# Patient Record
Sex: Male | Born: 1955 | Race: Black or African American | Hispanic: No | Marital: Single | Smoking: Never smoker
Health system: Southern US, Community
[De-identification: ages and names within clinical notes are randomized; demographics above are authoritative.]

## PROBLEM LIST (undated history)

## (undated) DIAGNOSIS — I10 Essential (primary) hypertension: Secondary | ICD-10-CM

## (undated) DIAGNOSIS — J45909 Unspecified asthma, uncomplicated: Secondary | ICD-10-CM

---

## 2014-11-02 ENCOUNTER — Emergency Department (HOSPITAL_COMMUNITY): Payer: Self-pay

## 2014-11-02 ENCOUNTER — Encounter (HOSPITAL_COMMUNITY): Payer: Self-pay | Admitting: *Deleted

## 2014-11-02 ENCOUNTER — Inpatient Hospital Stay (HOSPITAL_COMMUNITY)
Admission: EM | Admit: 2014-11-02 | Discharge: 2014-11-07 | DRG: 202 | Disposition: A | Discharge status: Home / Self Care | Payer: Self-pay | Attending: Internal Medicine | Admitting: Internal Medicine

## 2014-11-02 DIAGNOSIS — R9431 Abnormal electrocardiogram [ECG] [EKG]: Secondary | ICD-10-CM | POA: Diagnosis present

## 2014-11-02 DIAGNOSIS — J45901 Unspecified asthma with (acute) exacerbation: Principal | ICD-10-CM | POA: Diagnosis present

## 2014-11-02 DIAGNOSIS — E119 Type 2 diabetes mellitus without complications: Secondary | ICD-10-CM | POA: Diagnosis present

## 2014-11-02 DIAGNOSIS — E876 Hypokalemia: Secondary | ICD-10-CM | POA: Diagnosis present

## 2014-11-02 DIAGNOSIS — J9601 Acute respiratory failure with hypoxia: Secondary | ICD-10-CM | POA: Diagnosis present

## 2014-11-02 DIAGNOSIS — J4541 Moderate persistent asthma with (acute) exacerbation: Secondary | ICD-10-CM

## 2014-11-02 DIAGNOSIS — T380X5A Adverse effect of glucocorticoids and synthetic analogues, initial encounter: Secondary | ICD-10-CM | POA: Diagnosis not present

## 2014-11-02 DIAGNOSIS — N289 Disorder of kidney and ureter, unspecified: Secondary | ICD-10-CM | POA: Diagnosis not present

## 2014-11-02 DIAGNOSIS — J45909 Unspecified asthma, uncomplicated: Secondary | ICD-10-CM | POA: Insufficient documentation

## 2014-11-02 DIAGNOSIS — Z6841 Body Mass Index (BMI) 40.0 and over, adult: Secondary | ICD-10-CM

## 2014-11-02 DIAGNOSIS — R945 Abnormal results of liver function studies: Secondary | ICD-10-CM

## 2014-11-02 DIAGNOSIS — J9600 Acute respiratory failure, unspecified whether with hypoxia or hypercapnia: Secondary | ICD-10-CM | POA: Diagnosis present

## 2014-11-02 DIAGNOSIS — R7989 Other specified abnormal findings of blood chemistry: Secondary | ICD-10-CM | POA: Diagnosis present

## 2014-11-02 DIAGNOSIS — J1100 Influenza due to unidentified influenza virus with unspecified type of pneumonia: Secondary | ICD-10-CM | POA: Diagnosis present

## 2014-11-02 DIAGNOSIS — D72829 Elevated white blood cell count, unspecified: Secondary | ICD-10-CM

## 2014-11-02 DIAGNOSIS — R0600 Dyspnea, unspecified: Secondary | ICD-10-CM

## 2014-11-02 DIAGNOSIS — E669 Obesity, unspecified: Secondary | ICD-10-CM | POA: Diagnosis present

## 2014-11-02 DIAGNOSIS — T501X5A Adverse effect of loop [high-ceiling] diuretics, initial encounter: Secondary | ICD-10-CM | POA: Diagnosis not present

## 2014-11-02 DIAGNOSIS — I10 Essential (primary) hypertension: Secondary | ICD-10-CM | POA: Diagnosis present

## 2014-11-02 DIAGNOSIS — R197 Diarrhea, unspecified: Secondary | ICD-10-CM | POA: Diagnosis present

## 2014-11-02 HISTORY — DX: Unspecified asthma, uncomplicated: J45.909

## 2014-11-02 HISTORY — DX: Essential (primary) hypertension: I10

## 2014-11-02 LAB — CBC
HEMATOCRIT: 43.6 % (ref 39.0–52.0)
Hemoglobin: 14.2 g/dL (ref 13.0–17.0)
MCH: 25.9 pg — ABNORMAL LOW (ref 26.0–34.0)
MCHC: 32.6 g/dL (ref 30.0–36.0)
MCV: 79.4 fL (ref 78.0–100.0)
Platelets: 321 10*3/uL (ref 150–400)
RBC: 5.49 MIL/uL (ref 4.22–5.81)
RDW: 14.5 % (ref 11.5–15.5)
WBC: 7.2 10*3/uL (ref 4.0–10.5)

## 2014-11-02 LAB — I-STAT TROPONIN, ED: Troponin i, poc: 0.04 ng/mL (ref 0.00–0.08)

## 2014-11-02 LAB — BASIC METABOLIC PANEL
Anion gap: 8 (ref 5–15)
BUN: 9 mg/dL (ref 6–23)
CO2: 28 mmol/L (ref 19–32)
Calcium: 8 mg/dL — ABNORMAL LOW (ref 8.4–10.5)
Chloride: 102 mmol/L (ref 96–112)
Creatinine, Ser: 1.06 mg/dL (ref 0.50–1.35)
GFR, EST AFRICAN AMERICAN: 89 mL/min — AB (ref >90)
GFR, EST NON AFRICAN AMERICAN: 77 mL/min — AB (ref >90)
Glucose, Bld: 118 mg/dL — ABNORMAL HIGH (ref 70–99)
POTASSIUM: 3.4 mmol/L — AB (ref 3.5–5.1)
Sodium: 138 mmol/L (ref 135–145)

## 2014-11-02 LAB — LIPID PANEL
CHOLESTEROL: 120 mg/dL (ref 0–200)
HDL: 29 mg/dL — ABNORMAL LOW (ref >39)
LDL Cholesterol: 72 mg/dL (ref 0–99)
Total CHOL/HDL Ratio: 4.1 RATIO
Triglycerides: 94 mg/dL (ref <150)
VLDL: 19 mg/dL (ref 0–40)

## 2014-11-02 LAB — TROPONIN I: Troponin I: 0.05 ng/mL — ABNORMAL HIGH (ref <0.031)

## 2014-11-02 LAB — BRAIN NATRIURETIC PEPTIDE: B Natriuretic Peptide: 66 pg/mL (ref 0.0–100.0)

## 2014-11-02 MED ORDER — SODIUM CHLORIDE 0.9 % IJ SOLN
3.0000 mL | Freq: Two times a day (BID) | INTRAMUSCULAR | Status: DC
Start: 2014-11-02 — End: 2014-11-07
  Administered 2014-11-02 – 2014-11-07 (×9): 3 mL via INTRAVENOUS

## 2014-11-02 MED ORDER — DEXTROSE 5 % IV SOLN
1.0000 g | INTRAVENOUS | Status: DC
  Administered 2014-11-03 – 2014-11-04 (×2): 1 g via INTRAVENOUS
  Filled 2014-11-02 (×3): qty 10

## 2014-11-02 MED ORDER — ACETAMINOPHEN 650 MG RE SUPP
650.0000 mg | Freq: Four times a day (QID) | RECTAL | Status: DC | PRN
Start: 2014-11-02 — End: 2014-11-07

## 2014-11-02 MED ORDER — IPRATROPIUM-ALBUTEROL 0.5-2.5 (3) MG/3ML IN SOLN
3.0000 mL | RESPIRATORY_TRACT | Status: DC
  Administered 2014-11-02 – 2014-11-03 (×4): 3 mL via RESPIRATORY_TRACT
  Filled 2014-11-02 (×4): qty 3

## 2014-11-02 MED ORDER — GUAIFENESIN-DM 100-10 MG/5ML PO SYRP
5.0000 mL | ORAL_SOLUTION | ORAL | Status: DC | PRN
  Administered 2014-11-02 – 2014-11-03 (×2): 5 mL via ORAL
  Filled 2014-11-02 (×3): qty 5

## 2014-11-02 MED ORDER — DEXTROSE 5 % IV SOLN
500.0000 mg | INTRAVENOUS | Status: DC
  Administered 2014-11-03 – 2014-11-04 (×2): 500 mg via INTRAVENOUS
  Filled 2014-11-02 (×3): qty 500

## 2014-11-02 MED ORDER — FUROSEMIDE 10 MG/ML IJ SOLN
40.0000 mg | Freq: Once | INTRAMUSCULAR | Status: AC
  Administered 2014-11-02: 40 mg via INTRAVENOUS
  Filled 2014-11-02: qty 4

## 2014-11-02 MED ORDER — ALBUTEROL (5 MG/ML) CONTINUOUS INHALATION SOLN
10.0000 mg/h | INHALATION_SOLUTION | Freq: Once | RESPIRATORY_TRACT | Status: AC
  Administered 2014-11-02: 10 mg/h via RESPIRATORY_TRACT
  Filled 2014-11-02: qty 20

## 2014-11-02 MED ORDER — ALUM & MAG HYDROXIDE-SIMETH 200-200-20 MG/5ML PO SUSP: 30.0000 mL | Freq: Four times a day (QID) | ORAL | Status: DC | PRN

## 2014-11-02 MED ORDER — POTASSIUM CHLORIDE CRYS ER 20 MEQ PO TBCR: 40.0000 meq | EXTENDED_RELEASE_TABLET | Freq: Once | ORAL | Status: DC

## 2014-11-02 MED ORDER — BENZONATATE 100 MG PO CAPS
100.0000 mg | ORAL_CAPSULE | Freq: Three times a day (TID) | ORAL | Status: DC
  Administered 2014-11-02 – 2014-11-07 (×15): 100 mg via ORAL
  Filled 2014-11-02 (×16): qty 1

## 2014-11-02 MED ORDER — IPRATROPIUM BROMIDE 0.02 % IN SOLN
0.5000 mg | Freq: Once | RESPIRATORY_TRACT | Status: AC
  Administered 2014-11-02: 0.5 mg via RESPIRATORY_TRACT
  Filled 2014-11-02: qty 2.5

## 2014-11-02 MED ORDER — ONDANSETRON HCL 4 MG PO TABS: 4.0000 mg | ORAL_TABLET | Freq: Four times a day (QID) | ORAL | Status: DC | PRN

## 2014-11-02 MED ORDER — ACETAMINOPHEN 325 MG PO TABS: 650.0000 mg | ORAL_TABLET | Freq: Four times a day (QID) | ORAL | Status: DC | PRN

## 2014-11-02 MED ORDER — METHYLPREDNISOLONE SODIUM SUCC 125 MG IJ SOLR
60.0000 mg | Freq: Four times a day (QID) | INTRAMUSCULAR | Status: DC
  Administered 2014-11-02 – 2014-11-03 (×3): 60 mg via INTRAVENOUS
  Filled 2014-11-02 (×2): qty 0.96
  Filled 2014-11-02: qty 2
  Filled 2014-11-02 (×3): qty 0.96
  Filled 2014-11-02: qty 2

## 2014-11-02 MED ORDER — METHYLPREDNISOLONE SODIUM SUCC 125 MG IJ SOLR
125.0000 mg | Freq: Once | INTRAMUSCULAR | Status: AC
  Administered 2014-11-02: 125 mg via INTRAVENOUS
  Filled 2014-11-02: qty 2

## 2014-11-02 MED ORDER — ONDANSETRON HCL 4 MG/2ML IJ SOLN: 4.0000 mg | Freq: Four times a day (QID) | INTRAMUSCULAR | Status: DC | PRN

## 2014-11-02 MED ORDER — ENOXAPARIN SODIUM 40 MG/0.4ML ~~LOC~~ SOLN
40.0000 mg | SUBCUTANEOUS | Status: DC
  Administered 2014-11-02: 40 mg via SUBCUTANEOUS
  Filled 2014-11-02 (×2): qty 0.4

## 2014-11-02 MED ORDER — BUDESONIDE-FORMOTEROL FUMARATE 80-4.5 MCG/ACT IN AERO
2.0000 | INHALATION_SPRAY | Freq: Two times a day (BID) | RESPIRATORY_TRACT | Status: DC
  Administered 2014-11-03 – 2014-11-07 (×9): 2 via RESPIRATORY_TRACT
  Filled 2014-11-02: qty 6.9

## 2014-11-02 NOTE — ED Notes (Signed)
Patient with hx of sob for 1 week, worse for 2 days.  He reports cough with yellow and red sputum.  Patient also reports fevers.  He is weak and color is gray in color.  Patient has no known hx of heart failure.  He has tried tessalon pearls.  Patient has audible congestion   He took tylenol at 0800.  Patient is able to talk but in short sentences

## 2014-11-02 NOTE — ED Notes (Signed)
Respiratory notified pt in need of hour long continuous neb.

## 2014-11-02 NOTE — H&P (Addendum)
History and Physical       Hospital Admission Note Date: 11/02/2014  Patient name: Calvin Ramos Medical record number: [id number] Date of birth: 01/22/1956 Age: 59 y.o. Gender: male  PCP: No PCP Per Patient    Chief Complaint:  Fevers with Shortness of breath and wheezing for last 1 week  HPI: Patient is a 59 year old male with history of asthma, hypertension who presented with shortness of breath for 1 week worsening in the last 2 days. He also reported fevers and chills with temp of 102 F at home with productive cough, yellowish and red streaky sputum. Patient port see has no inhalers or nebulizers at home, was having audible wheezing. In the last 2 days, he also noticed loss of appetite, myalgias and shortness of breath worsening. Patient reports that in the last 1 year he has noticed dyspnea on exertion, he feels short of breath on walking one room to another. He denies any significant orthopnea or PND although sleeps on "big pillow" which makes him breathe better.  He has not seen any physician in the last several years. Patient was taking Tessalon Perles from his relative which did not help. He also admits to having diarrhea in last 1 week intermittently. Patient denies any chest pain, dizziness or lightheadedness.  In ED, patient received 1 dose of IV Lasix and prolonged albuterol nebulizer treatment with some improvement. BNP 66.0 troponin negative EKG showed rate 79, prolonged QTC 559, T-wave inversions V3 to V6, 2, 3 aVF.   Review of Systems:  Constitutional: +fever, chills, diaphoresis, poor appetite and fatigue.  HEENT: Denies photophobia, eye pain, redness, hearing loss, ear pain,+  congestion, sore throat, rhinorrhea, sneezing, mouth sores, trouble swallowing, neck pain, neck stiffness and tinnitus.   Respiratory: Please see history of present illness Cardiovascular: Denies chest pain, palpitations and leg swelling.   Gastrointestinal: Denies vomiting, abdominal pain, constipation, blood in stool and abdominal distention. + nausea, diarrhea  Genitourinary: Denies dysuria, urgency, frequency, hematuria, flank pain and difficulty urinating.  Musculoskeletal: Denies back pain, joint swelling, arthralgias and gait problem. + myalgias  Skin: Denies pallor, rash and wound.  Neurological: Denies dizziness, seizures, syncope, weakness, light-headedness, numbness and headaches.  Hematological: Denies adenopathy. Easy bruising, personal or family bleeding history  Psychiatric/Behavioral: Denies suicidal ideation, mood changes, confusion, nervousness, sleep disturbance and agitation  Past Medical History: Past Medical History  Diagnosis Date  . Hypertension   . Asthma    History reviewed. No pertinent past surgical history.  Medications: Prior to Admission medications    Patient is not taking any medications prior to admission, was taking Tessalon Perles from his relative in the last 2 days.  Allergies:  No Known Allergies  Social History:  reports that he has never smoked. He does not have any smokeless tobacco history on file. He reports that he does not drink alcohol or use illicit drugs.  Family History: Patient reports that his mother had heart attack in her 2440s otherwise no history of lung cancer, COPD, CHF in the family   Physical Exam: Blood pressure 149/83, pulse 85, temperature 98.2 F (36.8 C), temperature source Oral, resp. rate 26, height 5\' 7"  (1.702 m), weight 166.527 kg (367 lb 2 oz), SpO2 100 %. General: Alert, awake, oriented x3, in no acute distress, able to speak in full sentences . HEENT: normocephalic, atraumatic, anicteric sclera, pink conjunctiva, pupils equal and reactive to light and accomodation, oropharynx clear Neck: supple, no masses or lymphadenopathy, no goiter, no bruits  Heart: Regular rate  and rhythm, without murmurs, rubs or gallops. Lungs: coarse breath sounds  throughout with wheezing   Abdomen:  obese,Soft, nontender, nondistended, positive bowel sounds, no masses. Extremities: No clubbing, cyanosis or edema with positive pedal pulses. Neuro: Grossly intact, no focal neurological deficits, strength 5/5 upper and lower extremities bilaterally Psych: alert and oriented x 3, normal mood and affect Skin: no rashes or lesions, warm and dry   LABS on Admission:  Basic Metabolic Panel:  Recent Labs Lab 11/02/14 1327  NA 138  K 3.4*  CL 102  CO2 28  GLUCOSE 118*  BUN 9  CREATININE 1.06  CALCIUM 8.0*   Liver Function Tests: No results for input(s): AST, ALT, ALKPHOS, BILITOT, PROT, ALBUMIN in the last 168 hours. No results for input(s): LIPASE, AMYLASE in the last 168 hours. No results for input(s): AMMONIA in the last 168 hours. CBC:  Recent Labs Lab 11/02/14 1327  WBC 7.2  HGB 14.2  HCT 43.6  MCV 79.4  PLT 321   Cardiac Enzymes: No results for input(s): CKTOTAL, CKMB, CKMBINDEX, TROPONINI in the last 168 hours. BNP: Invalid input(s): POCBNP CBG: No results for input(s): GLUCAP in the last 168 hours.   Radiological Exams on Admission: Dg Chest Port 1 View  11/02/2014   CLINICAL DATA:  Increasing shortness of breath with cough and congestion.  EXAM: PORTABLE CHEST - 1 VIEW  COMPARISON:  None.  FINDINGS: Heart size is upper limits of normal. Central vascular structures are mildly prominent. No focal airspace disease. Patient is mildly rotated towards the right.  IMPRESSION: Slightly prominent lung markings could represent vascular congestion. No focal airspace disease.   Electronically Signed   By: Richarda Overlie M.D.   On: 11/02/2014 15:58    EKG showed rate 79, prolonged QTC 559, T-wave inversions in V3 to V6, 2, 3, aVF  Assessment/Plan Principal Problem:   Acute respiratory failure with hypoxia, dyspnea: Likely due to acute bronchitis, asthma exacerbation, also rule out any underlying heart failure, given his dyspnea on exertion  for a year, EKG changes. O2 sats 88% on room air at the time of admission -  obtain serial cardiac enzymes, influenza panel, sputum culture and sensitivities  - Placed on scheduled DuoNeb, IV Solu-Medrol, IV Zithromax, Rocephin, O2, Tessalon Perles, Robitussin - Obtain 2-D echocardiogram for further workup, may need cardiology consult   Active Problems:    Obesity - Patient counseled on diet and weight control    Essential hypertension - Currently not on any antihypertensives - BP elevated at the time of admission however patient was wheezing and in acute respiratory failure. May need to be on antihypertensives if  BP readings consistently elevated. - For now place on IV hydralazine as needed    Hypokalemia - Replaced    Abnormal EKG - Obtain serial cardiac enzymes, obtain lipid panel, no chest pain or active cardiac symptoms - obtain 2-D echocardiogram   Diarrhea - Obtain stool stool studies and C. difficile   DVT prophylaxis: LOVENOX   CODE STATUS:  full code   Family Communication: Admission, patients condition and plan of care including tests being ordered have been discussed with the patient and daughter who indicates understanding and agree with the plan and Code Status   Further plan will depend as patient's clinical course evolves and further radiologic and laboratory data become available.   Time Spent on Admission: 1 hour  RAI,RIPUDEEP M.D. [facility] 11/02/2014, 6:07 PM Pager: [phone]  If 7PM-7AM, please contact night-coverage [email or web address] Password [email or web address]

## 2014-11-02 NOTE — Progress Notes (Signed)
NURSING PROGRESS NOTE  Calvin DubinLuster Dicola [id number] Admission Data: 11/02/2014 7:40 PM Attending Provider: Cathren Harshipudeep K Rai, MD PCP:No PCP Per Patient Code Status: Full  Allergies:  Review of patient's allergies indicates no known allergies. Past Medical History:   has a past medical history of Hypertension and Asthma. Past Surgical History:   has no past surgical history on file. Social History:   reports that he has never smoked. He does not have any smokeless tobacco history on file. He reports that he does not drink alcohol or use illicit drugs.  Calvin Ramos is a 59 y.o. male patient admitted from ED:   Last Documented Vital Signs: Blood pressure 180/99, pulse 95, temperature 99 F (37.2 C), temperature source Oral, resp. rate 20, height 5\' 7"  (1.702 m), weight 164.837 kg (363 lb 6.4 oz), SpO2 95 %.  Cardiac Monitoring: Box # 5 in place. Cardiac monitor yields: No Data Yet  IV Fluids:  IV in place, occlusive dsg intact without redness, IV cath hand right, condition patent and no redness none.   Skin: WDL  Patient/Family orientated to room. Information packet given to patient/family. Admission inpatient armband information verified with patient/family to include name and date of birth and placed on patient arm. Side rails up x 2, fall assessment and education completed with patient/family. Patient/family able to verbalize understanding of risk associated with falls and verbalized understanding to call for assistance before getting out of bed. Call light within reach. Patient/family able to voice and demonstrate understanding of unit orientation instructions.    Will continue to evaluate and treat per MD orders.   Leane PlattSpencer Laquiesha Piacente RN, BS, BSN

## 2014-11-02 NOTE — ED Provider Notes (Signed)
CSN: [id number]     Arrival date & time 11/02/14  1315 History   First MD Initiated Contact with Patient 11/02/14 1502     Chief Complaint  Patient presents with  . Shortness of Breath  . Cough     (Consider location/radiation/quality/duration/timing/severity/associated sxs/prior Treatment) Patient is a 59 y.o. male presenting with shortness of breath and cough. The history is provided by the patient.  Shortness of Breath Severity:  Moderate Onset quality:  Gradual Duration:  1 week Timing:  Constant Progression:  Worsening Chronicity:  New Context: URI (had influenza like syndrome for past few week)   Relieved by:  Nothing Worsened by:  Nothing tried Associated symptoms: cough   Associated symptoms: no abdominal pain, no chest pain, no fever and no vomiting   Cough Associated symptoms: shortness of breath   Associated symptoms: no chest pain and no fever     Past Medical History  Diagnosis Date  . Hypertension   . Asthma    History reviewed. No pertinent past surgical history. No family history on file. History  Substance Use Topics  . Smoking status: Never Smoker   . Smokeless tobacco: Not on file  . Alcohol Use: No    Review of Systems  Constitutional: Negative for fever.  Respiratory: Positive for cough and shortness of breath.   Cardiovascular: Negative for chest pain and leg swelling.  Gastrointestinal: Negative for vomiting and abdominal pain.  All other systems reviewed and are negative.     Allergies  Review of patient's allergies indicates no known allergies.  Home Medications   Prior to Admission medications   Not on File   BP 162/102 mmHg  Pulse 88  Temp(Src) 98.2 F (36.8 C) (Oral)  Resp 90  Ht 5\' 7"  (1.702 m)  Wt 367 lb 2 oz (166.527 kg)  BMI 57.49 kg/m2  SpO2 88% Physical Exam  Constitutional: He is oriented to person, place, and time. He appears well-developed and well-nourished. No distress.  HENT:  Head: Normocephalic and  atraumatic.  Mouth/Throat: No oropharyngeal exudate.  Eyes: EOM are normal. Pupils are equal, round, and reactive to light.  Neck: Normal range of motion. Neck supple.  Cardiovascular: Normal rate and regular rhythm.  Exam reveals no friction rub.   No murmur heard. Pulmonary/Chest: He is in respiratory distress (moderate). He has wheezes (diffuse, moderate). He has no rales.  Abdominal: He exhibits no distension. There is no tenderness. There is no rebound.  Musculoskeletal: Normal range of motion. He exhibits no edema.  Neurological: He is alert and oriented to person, place, and time.  Skin: He is not diaphoretic.  Nursing note and vitals reviewed.   ED Course  Procedures (including critical care time) Labs Review Labs Reviewed  CBC - Abnormal; Notable for the following:    MCH 25.9 (*)    All other components within normal limits  BASIC METABOLIC PANEL - Abnormal; Notable for the following:    Potassium 3.4 (*)    Glucose, Bld 118 (*)    Calcium 8.0 (*)    GFR calc non Af Amer 77 (*)    GFR calc Af Amer 89 (*)    All other components within normal limits  BRAIN NATRIURETIC PEPTIDE  I-STAT TROPOININ, ED    Imaging Review Dg Chest Port 1 View  11/02/2014   CLINICAL DATA:  Increasing shortness of breath with cough and congestion.  EXAM: PORTABLE CHEST - 1 VIEW  COMPARISON:  None.  FINDINGS: Heart size is upper limits  of normal. Central vascular structures are mildly prominent. No focal airspace disease. Patient is mildly rotated towards the right.  IMPRESSION: Slightly prominent lung markings could represent vascular congestion. No focal airspace disease.   Electronically Signed   By: Richarda Overlie M.D.   On: 11/02/2014 15:58     EKG Interpretation   Date/Time:  Tuesday November 02 2014 13:25:57 EST Ventricular Rate:  79 PR Interval:  134 QRS Duration: 102 QT Interval:  488 QTC Calculation: 559 R Axis:   -41 Text Interpretation:  Long QTc Normal sinus rhythm Left axis  deviation ST  \T\ T wave abnormality, consider inferior ischemia ST \\T \ T wave  abnormality, consider anterolateral ischemia Prolonged QT Abnormal ECG No  prior for comparison Confirmed by Gwendolyn Grant  MD, Rolene Andrades ([id number]) on 11/02/2014  3:04:06 PM      MDM   Final diagnoses:  Asthma exacerbation    52M with hx of asthma presents with SOB. Present for past week, felt like he had the flu leading up to this. Ambulatory, but hypoxic on room air. On exam, labored breathing, short, choppy sentences. Patient has diffuse wheezes in all lung fields. Mild peripheral edema. No hx of heart failure. CXr shows vascular congestion, given lasix in case this is CHF. Improving, but still having trouble breathing. Admitted.   Elwin Mocha, MD 11/02/14 [phone]

## 2014-11-03 DIAGNOSIS — J9600 Acute respiratory failure, unspecified whether with hypoxia or hypercapnia: Secondary | ICD-10-CM

## 2014-11-03 LAB — BASIC METABOLIC PANEL
Anion gap: 11 (ref 5–15)
BUN: 13 mg/dL (ref 6–23)
CO2: 28 mmol/L (ref 19–32)
CREATININE: 1.28 mg/dL (ref 0.50–1.35)
Calcium: 8.1 mg/dL — ABNORMAL LOW (ref 8.4–10.5)
Chloride: 99 mmol/L (ref 96–112)
GFR calc Af Amer: 71 mL/min — ABNORMAL LOW (ref >90)
GFR, EST NON AFRICAN AMERICAN: 61 mL/min — AB (ref >90)
Glucose, Bld: 187 mg/dL — ABNORMAL HIGH (ref 70–99)
Potassium: 3.8 mmol/L (ref 3.5–5.1)
SODIUM: 138 mmol/L (ref 135–145)

## 2014-11-03 LAB — CBC
HEMATOCRIT: 42.8 % (ref 39.0–52.0)
Hemoglobin: 13.9 g/dL (ref 13.0–17.0)
MCH: 25.7 pg — ABNORMAL LOW (ref 26.0–34.0)
MCHC: 32.5 g/dL (ref 30.0–36.0)
MCV: 79.3 fL (ref 78.0–100.0)
Platelets: 330 10*3/uL (ref 150–400)
RBC: 5.4 MIL/uL (ref 4.22–5.81)
RDW: 14.5 % (ref 11.5–15.5)
WBC: 6.5 10*3/uL (ref 4.0–10.5)

## 2014-11-03 LAB — INFLUENZA PANEL BY PCR (TYPE A & B)
H1N1FLUPCR: NOT DETECTED
Influenza A By PCR: POSITIVE — AB
Influenza B By PCR: NEGATIVE

## 2014-11-03 LAB — TROPONIN I
Troponin I: 0.04 ng/mL — ABNORMAL HIGH (ref <0.031)
Troponin I: 0.03 ng/mL (ref <0.031)

## 2014-11-03 LAB — CLOSTRIDIUM DIFFICILE BY PCR: CDIFFPCR: NEGATIVE

## 2014-11-03 LAB — EXPECTORATED SPUTUM ASSESSMENT W REFEX TO RESP CULTURE

## 2014-11-03 LAB — EXPECTORATED SPUTUM ASSESSMENT W GRAM STAIN, RFLX TO RESP C

## 2014-11-03 MED ORDER — IPRATROPIUM-ALBUTEROL 0.5-2.5 (3) MG/3ML IN SOLN
3.0000 mL | Freq: Four times a day (QID) | RESPIRATORY_TRACT | Status: DC
  Administered 2014-11-03 – 2014-11-06 (×13): 3 mL via RESPIRATORY_TRACT
  Filled 2014-11-03 (×13): qty 3

## 2014-11-03 MED ORDER — METHYLPREDNISOLONE SODIUM SUCC 125 MG IJ SOLR
60.0000 mg | Freq: Two times a day (BID) | INTRAMUSCULAR | Status: DC
  Administered 2014-11-03 – 2014-11-04 (×2): 60 mg via INTRAVENOUS
  Filled 2014-11-03 (×2): qty 0.96

## 2014-11-03 MED ORDER — ALBUTEROL SULFATE (2.5 MG/3ML) 0.083% IN NEBU: 2.5000 mg | INHALATION_SOLUTION | RESPIRATORY_TRACT | Status: DC | PRN

## 2014-11-03 MED ORDER — ENOXAPARIN SODIUM 80 MG/0.8ML ~~LOC~~ SOLN
80.0000 mg | SUBCUTANEOUS | Status: DC
  Administered 2014-11-03 – 2014-11-06 (×4): 80 mg via SUBCUTANEOUS
  Filled 2014-11-03 (×5): qty 0.8

## 2014-11-03 NOTE — Progress Notes (Signed)
[facility] PROGRESS NOTE  Calvin Ramos ZOX:[id number] DOB: [birth year] DOA: 11/02/2014 PCP: No PCP Per Patient  Assessment/Plan: Acute respiratory failure secondary to asthma exacerbation and possibly early pneumonia. His influenza PCR is positive, but his symptoms started one and half week ago, outside the window of time frame,  he is afebrile and clinically he looks better. Taper steroids, and resume antibiotics. Resume symbicort.   Mildly elevated troponin's: He denies any chest pain.  2 D Echocardiogram ordered.  Repeat EKG pending. LE edema present.    Hypertension: better controlled.    Hypokalemia : replace as needed.   Diarrhea: resolved.     -   Code Status: full code.  Family Communication: none at bedside Disposition Plan: pending PT eval.    Consultants:  none  Procedures:  none  Antibiotics:  Rocephin and zithromax  HPI/Subjective: Sob improved, no chest pain.   Objective: Filed Vitals:   11/03/14 1509  BP: 149/81  Pulse:   Temp: 98.3 F (36.8 C)  Resp: 18    Intake/Output Summary (Last 24 hours) at 11/03/14 1953 Last data filed at 11/03/14 1524  Gross per 24 hour  Intake    960 ml  Output    800 ml  Net    160 ml   Filed Weights   11/02/14 1321 11/02/14 1906  Weight: 166.527 kg (367 lb 2 oz) 164.837 kg (363 lb 6.4 oz)    Exam:   General:  ALERT afebrile comfortable  Cardiovascular: s1s2  Respiratory: no wheezing heard, clear  Abdomen: soft nn tender non distended bowel sounds heard  Musculoskeletal: no pedal edema.   Data Reviewed: Basic Metabolic Panel:  Recent Labs Lab 11/02/14 1327 11/03/14 0225  NA 138 138  K 3.4* 3.8  CL 102 99  CO2 28 28  GLUCOSE 118* 187*  BUN 9 13  CREATININE 1.06 1.28  CALCIUM 8.0* 8.1*   Liver Function Tests: No results for input(s): AST, ALT, ALKPHOS, BILITOT, PROT, ALBUMIN in the last 168 hours. No results for input(s): LIPASE, AMYLASE in the last 168 hours. No results for  input(s): AMMONIA in the last 168 hours. CBC:  Recent Labs Lab 11/02/14 1327 11/03/14 0225  WBC 7.2 6.5  HGB 14.2 13.9  HCT 43.6 42.8  MCV 79.4 79.3  PLT 321 330   Cardiac Enzymes:  Recent Labs Lab 11/02/14 1901 11/03/14 0225 11/03/14 0635  TROPONINI 0.05* 0.04* 0.03   BNP (last 3 results)  Recent Labs  11/02/14 1327  BNP 66.0    ProBNP (last 3 results) No results for input(s): PROBNP in the last 8760 hours.  CBG: No results for input(s): GLUCAP in the last 168 hours.  Recent Results (from the past 240 hour(s))  Clostridium Difficile by PCR     Status: None   Collection Time: 11/03/14  2:04 AM  Result Value Ref Range Status   C difficile by pcr NEGATIVE NEGATIVE Final  Culture, expectorated sputum-assessment     Status: None   Collection Time: 11/03/14  2:04 AM  Result Value Ref Range Status   Specimen Description SPUTUM  Final   Special Requests NONE  Final   Sputum evaluation   Final    THIS SPECIMEN IS ACCEPTABLE. RESPIRATORY CULTURE REPORT TO FOLLOW.   Report Status 11/03/2014 FINAL  Final  Culture, respiratory (NON-Expectorated)     Status: None (Preliminary result)   Collection Time: 11/03/14  2:04 AM  Result Value Ref Range Status   Specimen Description SPUTUM  Final  Special Requests NONE  Final   Gram Stain   Final    ABUNDANT WBC PRESENT,BOTH PMN AND MONONUCLEAR RARE SQUAMOUS EPITHELIAL CELLS PRESENT FEW GRAM NEGATIVE RODS FEW GRAM POSITIVE COCCI IN PAIRS IN CHAINS RARE GRAM POSITIVE RODS    Culture PENDING  Incomplete   Report Status PENDING  Incomplete     Studies: Dg Chest Port 1 View  11/02/2014   CLINICAL DATA:  Increasing shortness of breath with cough and congestion.  EXAM: PORTABLE CHEST - 1 VIEW  COMPARISON:  None.  FINDINGS: Heart size is upper limits of normal. Central vascular structures are mildly prominent. No focal airspace disease. Patient is mildly rotated towards the right.  IMPRESSION: Slightly prominent lung markings  could represent vascular congestion. No focal airspace disease.   Electronically Signed   By: Richarda OverlieAdam  Henn M.D.   On: 11/02/2014 15:58    Scheduled Meds: . azithromycin  500 mg Intravenous Q24H  . benzonatate  100 mg Oral TID  . budesonide-formoterol  2 puff Inhalation BID  . cefTRIAXone (ROCEPHIN)  IV  1 g Intravenous Q24H  . enoxaparin (LOVENOX) injection  80 mg Subcutaneous Q24H  . ipratropium-albuterol  3 mL Nebulization Q6H  . methylPREDNISolone (SOLU-MEDROL) injection  60 mg Intravenous Q12H  . potassium chloride  40 mEq Oral Once  . sodium chloride  3 mL Intravenous Q12H   Continuous Infusions:   Principal Problem:   Acute respiratory failure Active Problems:   Acute asthma exacerbation   Dyspnea   Obesity   Essential hypertension   Hypokalemia   Abnormal EKG    Time spent: 25 MINUTES    Charl[facility] Va Medical CenterKULA,Reinette Cuneo  [facility] Pager [phone]. If 7PM-7AM, please contact night-coverage at [email or web address], password [email or web address] 11/03/2014, 7:53 PM  LOS: 0 days

## 2014-11-03 NOTE — Care Management Note (Unsigned)
    Page 1 of 1   11/03/2014     12:28:30 PM CARE MANAGEMENT NOTE 11/03/2014  Patient:  Calvin Ramos,Calvin Ramos   Account Number:  [id number]  Date Initiated:  11/03/2014  Documentation initiated by:  Letha CapeAYLOR,Maleah Rabago  Subjective/Objective Assessment:   dx flu, asthma ex  admit- from home     Action/Plan:   Anticipated DC Date:  11/03/2014   Anticipated DC Plan:  HOME/SELF CARE      DC Planning Services  CM consult  [facility]      Choice offered to / List presented to:             Status of service:  In process, will continue to follow Medicare Important Message given?  NO (If response is "NO", the following Medicare IM given date fields will be blank) Date Medicare IM given:   Medicare IM given by:   Date Additional Medicare IM given:   Additional Medicare IM given by:    Discharge Disposition:    Per UR Regulation:  Reviewed for med. necessity/level of care/duration of stay  If discussed at Long Length of Stay Meetings, dates discussed:    Comments:  11/03/14 1226 Letha Capeeborah Teressa Mcglocklin RN BSN [phone] NCM gave patient information on [facility] for a walk in apt after discharge.  He will  need to go there to get his medications as well for $4 or $10 at discharge, they close at 6 pm.

## 2014-11-03 NOTE — Progress Notes (Signed)
HO Akula paged, EKG complete and in patient chart.

## 2014-11-03 NOTE — Progress Notes (Signed)
HO Akula paged to let her know our unit does not have EKG machine, and we have been trying to borrow one from another without success since the 1st order was placed, we are continually looking and will notify the MD as soon as we get the EKG.

## 2014-11-03 NOTE — Progress Notes (Signed)
UR completed 

## 2014-11-03 NOTE — Progress Notes (Signed)
HO Akula made aware face to face of patient decreasing GFR.

## 2014-11-03 NOTE — Progress Notes (Signed)
HO Schorr paged to come evaluate EKG in patient chart, shows patient has T wave and ST abnormality, asked to come evaluate EKG placed in patient chart.

## 2014-11-04 DIAGNOSIS — I4581 Long QT syndrome: Secondary | ICD-10-CM

## 2014-11-04 DIAGNOSIS — R0600 Dyspnea, unspecified: Secondary | ICD-10-CM

## 2014-11-04 LAB — FECAL LACTOFERRIN, QUANT: Fecal Lactoferrin: NEGATIVE

## 2014-11-04 LAB — OVA AND PARASITE EXAMINATION: OVA AND PARASITES: NONE SEEN

## 2014-11-04 LAB — HEMOGLOBIN A1C
Hgb A1c MFr Bld: 6.6 % — ABNORMAL HIGH (ref 4.8–5.6)
MEAN PLASMA GLUCOSE: 143 mg/dL

## 2014-11-04 LAB — GLUCOSE, CAPILLARY
GLUCOSE-CAPILLARY: 167 mg/dL — AB (ref 70–99)
Glucose-Capillary: 155 mg/dL — ABNORMAL HIGH (ref 70–99)
Glucose-Capillary: 155 mg/dL — ABNORMAL HIGH (ref 70–99)
Glucose-Capillary: 141 mg/dL — ABNORMAL HIGH (ref 70–99)

## 2014-11-04 MED ORDER — DOXYCYCLINE HYCLATE 100 MG PO TABS
100.0000 mg | ORAL_TABLET | Freq: Two times a day (BID) | ORAL | Status: DC
  Administered 2014-11-04 – 2014-11-07 (×6): 100 mg via ORAL
  Filled 2014-11-04 (×8): qty 1

## 2014-11-04 MED ORDER — FUROSEMIDE 20 MG PO TABS
20.0000 mg | ORAL_TABLET | Freq: Every day | ORAL | Status: DC
  Administered 2014-11-04 – 2014-11-05 (×2): 20 mg via ORAL
  Filled 2014-11-04 (×2): qty 1

## 2014-11-04 MED ORDER — INSULIN ASPART 100 UNIT/ML ~~LOC~~ SOLN
0.0000 [IU] | Freq: Three times a day (TID) | SUBCUTANEOUS | Status: DC
Start: 2014-11-05 — End: 2014-11-07
  Administered 2014-11-05 (×3): 1 [IU] via SUBCUTANEOUS
  Administered 2014-11-06: 2 [IU] via SUBCUTANEOUS
  Administered 2014-11-07: 1 [IU] via SUBCUTANEOUS

## 2014-11-04 MED ORDER — METHYLPREDNISOLONE SODIUM SUCC 40 MG IJ SOLR
40.0000 mg | Freq: Two times a day (BID) | INTRAMUSCULAR | Status: DC
  Administered 2014-11-04 – 2014-11-05 (×2): 40 mg via INTRAVENOUS
  Filled 2014-11-04 (×4): qty 1

## 2014-11-04 MED ORDER — INSULIN ASPART 100 UNIT/ML ~~LOC~~ SOLN
0.0000 [IU] | Freq: Every day | SUBCUTANEOUS | Status: DC
  Administered 2014-11-06: 2 [IU] via SUBCUTANEOUS

## 2014-11-04 MED ORDER — LISINOPRIL 2.5 MG PO TABS
2.5000 mg | ORAL_TABLET | Freq: Every day | ORAL | Status: DC
  Administered 2014-11-04 – 2014-11-07 (×4): 2.5 mg via ORAL
  Filled 2014-11-04 (×5): qty 1

## 2014-11-04 MED ORDER — AZITHROMYCIN 500 MG PO TABS: 500.0000 mg | ORAL_TABLET | ORAL | Status: DC

## 2014-11-04 NOTE — Consult Note (Signed)
Reason for Consult: Abnormal ECg  Requesting Physician: Blake Divine  Cardiologist: None  HPI: This is a 59 y.o. male with a past medical history significant for obesity, diet controlled HTN and asthma admitted with respiratory difficulty due to influenza.   His admission ECG and a repeat tracing yesterday are both abnormal, with marked QT interval prolongation and diffuse, broad inverted T waves in most leads. No old ECG available. Admission ECG performed before he received azithromycin. No major biochemical abnormalities now. Mild hypokalemia and hypocalcemia on admission, K normal yesterday, Ca still slightly low.  He does not have chest discomfort, either pleuritic or anginal and denies syncope or palpitations.  Echo today is normal.  No definite family history of unexplained sudden death. "mother had heart attack in her 53s", details unclear.  PMHx:  Past Medical History  Diagnosis Date  . Hypertension   . Asthma    History reviewed. No pertinent past surgical history.  FAMHx: No family history of inheritable cardiac dsorders.  SOCHx:  reports that he has never smoked. He does not have any smokeless tobacco history on file. He reports that he does not drink alcohol or use illicit drugs.  ALLERGIES: No Known Allergies  ROS: Constitutional: +fever, chills, diaphoresis, poor appetite and fatigue.  HEENT: Denies photophobia, eye pain, redness, hearing loss, ear pain,+ congestion, sore throat, rhinorrhea, sneezing, mouth sores, trouble swallowing, neck pain, neck stiffness and tinnitus.  Respiratory: Please see history of present illness Cardiovascular: Denies chest pain, palpitations and leg swelling.  Gastrointestinal: Denies vomiting, abdominal pain, constipation, blood in stool and abdominal distention. + nausea, diarrhea  Genitourinary: Denies dysuria, urgency, frequency, hematuria, flank pain and difficulty urinating.  Musculoskeletal: Denies back pain, joint  swelling, arthralgias and gait problem. + myalgias  Skin: Denies pallor, rash and wound.  Neurological: Denies dizziness, seizures, syncope, weakness, light-headedness, numbness and headaches.  Hematological: Denies adenopathy. Easy bruising, personal or family bleeding history  Psychiatric/Behavioral: Denies suicidal ideation, mood changes, confusion, nervousness, sleep disturbance and agitation  HOME MEDICATIONS: No prescriptions prior to admission    HOSPITAL MEDICATIONS: Prior to Admission:  No prescriptions prior to admission   Scheduled: . [START ON 11/05/2014] azithromycin  500 mg Oral Q24H  . benzonatate  100 mg Oral TID  . budesonide-formoterol  2 puff Inhalation BID  . cefTRIAXone (ROCEPHIN)  IV  1 g Intravenous Q24H  . enoxaparin (LOVENOX) injection  80 mg Subcutaneous Q24H  . ipratropium-albuterol  3 mL Nebulization Q6H  . methylPREDNISolone (SOLU-MEDROL) injection  60 mg Intravenous Q12H  . potassium chloride  40 mEq Oral Once  . sodium chloride  3 mL Intravenous Q12H    VITALS: Blood pressure 155/80, pulse 102, temperature 98.2 F (36.8 C), temperature source Oral, resp. rate 18, height  (1.702 m), weight 363 lb 6.4 oz (164.837 kg), SpO2 92 %.  PHYSICAL EXAM:  General: Alert, oriented x3, no distress. Exam limited by obesity Head: no evidence of trauma, PERRL, EOMI, no exophtalmos or lid lag, no myxedema, no xanthelasma; normal ears, nose and oropharynx Neck: no jugular venous pulsations and no hepatojugular reflux; brisk carotid pulses without delay and no carotid bruits Chest: clear to auscultation, no signs of consolidation by percussion or palpation, normal fremitus, symmetrical and full respiratory excursions Cardiovascular: normal position and quality of the apical impulse, regular rhythm, normal first heart sound and normal second heart sound, no rubs or gallops, no murmur Abdomen: no tenderness or distention, no masses by palpation, no abnormal  pulsatility  or arterial bruits, normal bowel sounds, no hepatosplenomegaly Extremities: no clubbing, cyanosis;  1+ ankle bilateral edema; 2+ radial, ulnar and brachial pulses bilaterally; 2+ right femoral, posterior tibial and dorsalis pedis pulses; 2+ left femoral, posterior tibial and dorsalis pedis pulses; no subclavian or femoral bruits Neurological: grossly nonfocal   LABS  CBC  Recent Labs  11/02/14 1327 11/03/14 0225  WBC 7.2 6.5  HGB 14.2 13.9  HCT 43.6 42.8  MCV 79.4 79.3  PLT 321 330   Basic Metabolic Panel  Recent Labs  11/02/14 1327 11/03/14 0225  NA 138 138  K 3.4* 3.8  CL 102 99  CO2 28 28  GLUCOSE 118* 187*  BUN 9 13  CREATININE 1.06 1.28  CALCIUM 8.0* 8.1*   Liver Function Tests No results for input(s): AST, ALT, ALKPHOS, BILITOT, PROT, ALBUMIN in the last 72 hours. No results for input(s): LIPASE, AMYLASE in the last 72 hours. Cardiac Enzymes  Recent Labs  11/02/14 1901 11/03/14 0225 11/03/14 0635  TROPONINI 0.05* 0.04* 0.03   BNP Invalid input(s): POCBNP D-Dimer No results for input(s): DDIMER in the last 72 hours. Hemoglobin A1C  Recent Labs  11/02/14 1930  HGBA1C 6.6*   Fasting Lipid Panel  Recent Labs  11/02/14 1930  CHOL 120  HDL 29*  LDLCALC 72  TRIG 94  CHOLHDL 4.1   Thyroid Function Tests No results for input(s): TSH, T4TOTAL, T3FREE, THYROIDAB in the last 72 hours.  Invalid input(s): FREET3    IMAGING: Dg Chest Port 1 View  11/02/2014   CLINICAL DATA:  Increasing shortness of breath with cough and congestion.  EXAM: PORTABLE CHEST - 1 VIEW  COMPARISON:  None.  FINDINGS: Heart size is upper limits of normal. Central vascular structures are mildly prominent. No focal airspace disease. Patient is mildly rotated towards the right.  IMPRESSION: Slightly prominent lung markings could represent vascular congestion. No focal airspace disease.   Electronically Signed   By: Richarda OverlieAdam  Henn M.D.   On: 11/02/2014 15:58     ECG: NSR, broad symmetrical inverted T waves in inferior and anterolateral leads, QTC>500 ms  TELEMETRY: NSR/mild sinus tachycardia  IMPRESSION: 1. Long QT syndrome. Differential diagnosis  - congenital, newly discovered: old ecg would be invaluable, no family history of SCD  - electrolyte abnormalities: need to check Mg and ionized calcium and albumin levels  - medication: not on any drugs on admission, but has received macrolides since  - ischemia/coronary disease: no symptoms thereof and limited risk factors (excellent lipid profile except low HDL, diet controlled DM, obesity, maybe mild HBP)  - resolving pericarditis related to viral illness: but no pericardial effusion, pericardial rub or pleuritic pain. 2. Nominally abnormal troponin I levels of very doubtful significance  RECOMMENDATION: 1. Check Mg, ionized Ca, albumin 2. Outpatient treadmill myoview once respiratory illness has resolved 3. Avoid QT prolonging drugs - if possible substitute macrolide with alternative agent to cover atypicals (e.g. Doxycycline)   Time Spent Directly with Patient: 45 minutes  Thurmon FairMihai Celita Aron, MD, [facility] [phone] office [phone] pager   11/04/2014, 2:44 PM

## 2014-11-04 NOTE — Progress Notes (Signed)
[facility] PROGRESS NOTE  Calvin DubinLuster Ramos WUJ:[id number] DOB: 1956-08-04 DOA: 11/02/2014 PCP: No PCP Per Patient Interim summary: 59 y.o male with prior h/o hypertension, asthma, obesity admitted for sob. He was admitted for acute asthma exacerbation.  Assessment/Plan: Acute respiratory failure secondary to asthma exacerbation and possibly early pneumonia. His influenza PCR is positive, but his symptoms started one and half week ago, hence did not start him on tamiflu. currently  he is afebrile and clinically he looks better.  Taper IV solumedrol from 60 mg IV every 12 hours to 40 mg twice a day as his wheezing has improved. Resume duonebs and symbicort. Though his breathing is better he is still requiring 2 liters/min of [place] oxygen, plan to wean him off the oxygen and start physical therapy.  He was initially started on rocephin and zithromax, later on changed to oral doxycycline because of his prolonged QT interval on EKG from last night.    Mildly elevated troponin's: He denies any chest pain.  2 D Echocardiogram ordered, does not show any regional wall abnormalities.  Echo reviewed good LVEF and normal diastolic parameters. His EKG shows diffuse t wave inversions and prolonged qt interval. Cardiology consulted to see if the patient needs stress test evaluation in view of his elevated troponins and abnormal EKG. Calcium and mag levels ordered .   Hypertension:  Not well controlled. Would start him on low dose lasix to improve his pedal edema, also should improve his BP.   Hypokalemia : replace as needed.   Diarrhea: resolved. C DIFF PCR is negative. And GI pathogen pcr still pending.   Abnormal EKG/ Prolonged QT interval on EKG: Stopped zithromax and started on doxy.  Electrolytes ordered.  Repeat EKG in am.   New diagnosis of Diabetes mellitus: - hgba1c is 6.6. Start him SSI.  Plan to start him on metformin on discharge.  Get diabetes education/ coordinator. Would start him on low  dose lisinopril today.   Code Status: full code.  Family Communication: none at bedside Disposition Plan: pending PT eval and possibly home when his asthma improves.    Consultants:  cardiology  Procedures:  none  Antibiotics:  Rocephin and zithromax until 3/3  Doxycycline from 3/3  HPI/Subjective: Sob improved, no chest pain. He wants to know when he can go home.   Objective: Filed Vitals:   11/04/14 1420  BP: 155/80  Pulse: 102  Temp: 98.2 F (36.8 C)  Resp: 18    Intake/Output Summary (Last 24 hours) at 11/04/14 1823 Last data filed at 11/04/14 1451  Gross per 24 hour  Intake   1200 ml  Output   1200 ml  Net      0 ml   Filed Weights   11/02/14 1321 11/02/14 1906  Weight: 166.527 kg (367 lb 2 oz) 164.837 kg (363 lb 6.4 oz)    Exam:   General:  ALERT afebrile comfortable, not in any distress.   Cardiovascular: s1s2, tachycardic.   Respiratory: scattered wheezing , no rhonchi. Air entry fair.  Abdomen: soft non tender non distended bowel sounds heard  Musculoskeletal: lower extremity edema present.   Data Reviewed: Basic Metabolic Panel:  Recent Labs Lab 11/02/14 1327 11/03/14 0225  NA 138 138  K 3.4* 3.8  CL 102 99  CO2 28 28  GLUCOSE 118* 187*  BUN 9 13  CREATININE 1.06 1.28  CALCIUM 8.0* 8.1*   Liver Function Tests: No results for input(s): AST, ALT, ALKPHOS, BILITOT, PROT, ALBUMIN in the last 168  hours. No results for input(s): LIPASE, AMYLASE in the last 168 hours. No results for input(s): AMMONIA in the last 168 hours. CBC:  Recent Labs Lab 11/02/14 1327 11/03/14 0225  WBC 7.2 6.5  HGB 14.2 13.9  HCT 43.6 42.8  MCV 79.4 79.3  PLT 321 330   Cardiac Enzymes:  Recent Labs Lab 11/02/14 1901 11/03/14 0225 11/03/14 0635  TROPONINI 0.05* 0.04* 0.03   BNP (last 3 results)  Recent Labs  11/02/14 1327  BNP 66.0    ProBNP (last 3 results) No results for input(s): PROBNP in the last 8760 hours.  CBG:  Recent  Labs Lab 11/04/14 0745 11/04/14 1209 11/04/14 1702  GLUCAP 167* 155* 155*    Recent Results (from the past 240 hour(s))  Culture, blood (routine x 2)     Status: None (Preliminary result)   Collection Time: 11/02/14  9:34 PM  Result Value Ref Range Status   Specimen Description BLOOD LEFT HAND  Final   Special Requests BOTTLES DRAWN AEROBIC ONLY 7CC  Final   Culture   Final           BLOOD CULTURE RECEIVED NO GROWTH TO DATE CULTURE WILL BE HELD FOR 5 DAYS BEFORE ISSUING A FINAL NEGATIVE REPORT Performed at [facility]    Report Status PENDING  Incomplete  Culture, blood (routine x 2)     Status: None (Preliminary result)   Collection Time: 11/02/14  9:49 PM  Result Value Ref Range Status   Specimen Description BLOOD LEFT HAND  Final   Special Requests BOTTLES DRAWN AEROBIC ONLY 5CC  Final   Culture   Final           BLOOD CULTURE RECEIVED NO GROWTH TO DATE CULTURE WILL BE HELD FOR 5 DAYS BEFORE ISSUING A FINAL NEGATIVE REPORT Performed at [facility]    Report Status PENDING  Incomplete  Ova and parasite examination     Status: None   Collection Time: 11/03/14  2:04 AM  Result Value Ref Range Status   Specimen Description STOOL  Final   Special Requests NONE  Final   Ova and parasites   Final    NO OVA OR PARASITES SEEN Performed at [facility]    Report Status 11/04/2014 FINAL  Final  Stool culture     Status: None (Preliminary result)   Collection Time: 11/03/14  2:04 AM  Result Value Ref Range Status   Specimen Description STOOL  Final   Special Requests NONE  Final   Culture   Final    Culture reincubated for better growth Performed at [facility]    Report Status PENDING  Incomplete  Clostridium Difficile by PCR     Status: None   Collection Time: 11/03/14  2:04 AM  Result Value Ref Range Status   C difficile by pcr NEGATIVE NEGATIVE Final  Culture, expectorated sputum-assessment     Status: None   Collection Time:  11/03/14  2:04 AM  Result Value Ref Range Status   Specimen Description SPUTUM  Final   Special Requests NONE  Final   Sputum evaluation   Final    THIS SPECIMEN IS ACCEPTABLE. RESPIRATORY CULTURE REPORT TO FOLLOW.   Report Status 11/03/2014 FINAL  Final  Culture, respiratory (NON-Expectorated)     Status: None (Preliminary result)   Collection Time: 11/03/14  2:04 AM  Result Value Ref Range Status   Specimen Description SPUTUM  Final   Special Requests NONE  Final  Gram Stain   Final    ABUNDANT WBC PRESENT,BOTH PMN AND MONONUCLEAR RARE SQUAMOUS EPITHELIAL CELLS PRESENT FEW GRAM NEGATIVE RODS FEW GRAM POSITIVE COCCI IN PAIRS IN CHAINS RARE GRAM POSITIVE RODS    Culture   Final    NORMAL OROPHARYNGEAL FLORA Performed at [facility]    Report Status PENDING  Incomplete     Studies: No results found.  Scheduled Meds: . benzonatate  100 mg Oral TID  . budesonide-formoterol  2 puff Inhalation BID  . doxycycline  100 mg Oral Q12H  . enoxaparin (LOVENOX) injection  80 mg Subcutaneous Q24H  . ipratropium-albuterol  3 mL Nebulization Q6H  . methylPREDNISolone (SOLU-MEDROL) injection  40 mg Intravenous Q12H  . potassium chloride  40 mEq Oral Once  . sodium chloride  3 mL Intravenous Q12H   Continuous Infusions:   Principal Problem:   Acute respiratory failure Active Problems:   Acute asthma exacerbation   Dyspnea   Obesity   Essential hypertension   Hypokalemia   Abnormal EKG    Time spent: 25 MINUTES    [facility]  [facility] Pager [phone]. If 7PM-7AM, please contact night-coverage at [email or web address], password [facility] 11/04/2014, 6:23 PM  LOS: 1 day

## 2014-11-04 NOTE — Progress Notes (Signed)
PHARMACIST - PHYSICIAN COMMUNICATION DR:   Blake DivineAkula CONCERNING: Antibiotic IV to Oral Route Change Policy  RECOMMENDATION: This patient is receiving azithromycin by the intravenous route.  Based on criteria approved by the Pharmacy and Therapeutics Committee, the antibiotic(s) is/are being converted to the equivalent oral dose form(s).   DESCRIPTION: These criteria include:  Patient being treated for a respiratory tract infection, urinary tract infection, cellulitis or clostridium difficile associated diarrhea if on metronidazole  The patient is not neutropenic and does not exhibit a GI malabsorption state  The patient is eating (either orally or via tube) and/or has been taking other orally administered medications for a least 24 hours  The patient is improving clinically and has a Tmax < 100.5  If you have questions about this conversion, please contact the Pharmacy Department  []   [phone]( [phone]  [facility] [x]   [phone] )  [facility]  []   [phone]( [phone] )  [facility] []   [phone]( [phone] )  [facility]   Dorna LeitzAnh Cleora Karnik, PharmD, BCPS

## 2014-11-04 NOTE — Progress Notes (Signed)
  Echocardiogram 2D Echocardiogram has been performed.  Calvin Ramos, Calvin Ramos 11/04/2014, 1:06 PM

## 2014-11-05 DIAGNOSIS — R9431 Abnormal electrocardiogram [ECG] [EKG]: Secondary | ICD-10-CM | POA: Diagnosis present

## 2014-11-05 DIAGNOSIS — J9601 Acute respiratory failure with hypoxia: Secondary | ICD-10-CM

## 2014-11-05 LAB — CBC
HEMATOCRIT: 41.3 % (ref 39.0–52.0)
HEMOGLOBIN: 13 g/dL (ref 13.0–17.0)
MCH: 25.2 pg — ABNORMAL LOW (ref 26.0–34.0)
MCHC: 31.5 g/dL (ref 30.0–36.0)
MCV: 80 fL (ref 78.0–100.0)
Platelets: 391 10*3/uL (ref 150–400)
RBC: 5.16 MIL/uL (ref 4.22–5.81)
RDW: 14.7 % (ref 11.5–15.5)
WBC: 13.4 10*3/uL — AB (ref 4.0–10.5)

## 2014-11-05 LAB — GI PATHOGEN PANEL BY PCR, STOOL
C DIFFICILE TOXIN A/B: NOT DETECTED
CAMPYLOBACTER BY PCR: NOT DETECTED
CRYPTOSPORIDIUM BY PCR: NOT DETECTED
E COLI (ETEC) LT/ST: NOT DETECTED
E coli (STEC): NOT DETECTED
E coli 0157 by PCR: NOT DETECTED
G lamblia by PCR: NOT DETECTED
NOROVIRUS G1/G2: NOT DETECTED
Rotavirus A by PCR: NOT DETECTED
Salmonella by PCR: NOT DETECTED
Shigella by PCR: NOT DETECTED

## 2014-11-05 LAB — COMPREHENSIVE METABOLIC PANEL
ALK PHOS: 63 U/L (ref 39–117)
ALT: 100 U/L — ABNORMAL HIGH (ref 0–53)
ANION GAP: 14 (ref 5–15)
AST: 56 U/L — ABNORMAL HIGH (ref 0–37)
Albumin: 3.2 g/dL — ABNORMAL LOW (ref 3.5–5.2)
BILIRUBIN TOTAL: 0.5 mg/dL (ref 0.3–1.2)
BUN: 20 mg/dL (ref 6–23)
CO2: 27 mmol/L (ref 19–32)
CREATININE: 1.36 mg/dL — AB (ref 0.50–1.35)
Calcium: 8.4 mg/dL (ref 8.4–10.5)
Chloride: 97 mmol/L (ref 96–112)
GFR calc Af Amer: 66 mL/min — ABNORMAL LOW (ref >90)
GFR, EST NON AFRICAN AMERICAN: 57 mL/min — AB (ref >90)
Glucose, Bld: 154 mg/dL — ABNORMAL HIGH (ref 70–99)
POTASSIUM: 4.4 mmol/L (ref 3.5–5.1)
Sodium: 138 mmol/L (ref 135–145)
Total Protein: 7.2 g/dL (ref 6.0–8.3)

## 2014-11-05 LAB — CULTURE, RESPIRATORY: CULTURE: NORMAL

## 2014-11-05 LAB — GLUCOSE, CAPILLARY
GLUCOSE-CAPILLARY: 145 mg/dL — AB (ref 70–99)
Glucose-Capillary: 140 mg/dL — ABNORMAL HIGH (ref 70–99)
Glucose-Capillary: 122 mg/dL — ABNORMAL HIGH (ref 70–99)
Glucose-Capillary: 165 mg/dL — ABNORMAL HIGH (ref 70–99)

## 2014-11-05 LAB — CULTURE, RESPIRATORY W GRAM STAIN

## 2014-11-05 LAB — MAGNESIUM: MAGNESIUM: 2.3 mg/dL (ref 1.5–2.5)

## 2014-11-05 LAB — CALCIUM, IONIZED: Calcium, Ion: 1.13 mmol/L (ref 1.12–1.32)

## 2014-11-05 MED ORDER — LIVING WELL WITH DIABETES BOOK
Freq: Once | Status: AC
  Administered 2014-11-05: 08:00:00
  Filled 2014-11-05: qty 1

## 2014-11-05 MED ORDER — PREDNISONE 50 MG PO TABS
60.0000 mg | ORAL_TABLET | Freq: Every day | ORAL | Status: DC
  Administered 2014-11-06 – 2014-11-07 (×2): 60 mg via ORAL
  Filled 2014-11-05 (×3): qty 1

## 2014-11-05 NOTE — Evaluation (Signed)
Physical Therapy Evaluation Patient Details Name: Titus DubinLuster Haberer MRN: [id number] DOB: May 28, 1956 Today's Date: 11/05/2014   History of Present Illness  Patient is a 59 year old male with history of asthma, hypertension who presented with shortness of breath for 1 week worsening in the last 2 days.   SOB/hypoxia like due to acute bronchitis/ asthmatic exac. and need to R/O Heart failure.  Work up continues  Clinical Impression  Pt not at baseline respiratory function, but maintains adequate or close to adequate oxygenation during short duration of activity and pt recovers quickly.  General function is approaching baseline.  See oxygen qualification.  No further PT needs.     Follow Up Recommendations No PT follow up;Supervision - Intermittent    Equipment Recommendations  None recommended by PT    Recommendations for Other Services       Precautions / Restrictions        Mobility  Bed Mobility               General bed mobility comments: already in chair  Transfers Overall transfer level: Independent                  Ambulation/Gait Ambulation/Gait assistance: Independent Ambulation Distance (Feet): 230 Feet Assistive device: None Gait Pattern/deviations: WFL(Within Functional Limits) Gait velocity: slower   General Gait Details: generally steady gait, limited by SOB and worsened by wearing a droplet prec. mask.  Stairs            Wheelchair Mobility    Modified Rankin (Stroke Patients Only)       Balance Overall balance assessment: Needs assistance Sitting-balance support: No upper extremity supported Sitting balance-Leahy Scale: Good     Standing balance support: No upper extremity supported Standing balance-Leahy Scale: Good                               Pertinent Vitals/Pain Pain Assessment: No/denies pain    Home Living Family/patient expects to be discharged to:: Private residence Living Arrangements: Alone Available  Help at Discharge: Family;Available PRN/intermittently Type of Home: Apartment Home Access: Stairs to enter;Level entry     Home Layout: One level Home Equipment: None      Prior Function Level of Independence: Independent               Hand Dominance        Extremity/Trunk Assessment               Lower Extremity Assessment: Overall WFL for tasks assessed      Cervical / Trunk Assessment: Normal  Communication   Communication: No difficulties  Cognition   Behavior During Therapy: WFL for tasks assessed/performed Overall Cognitive Status: Within Functional Limits for tasks assessed                      General Comments General comments (skin integrity, edema, etc.): SpO2 at rest on RA  90% and HR in th upper 90's,  after MMT SpO2 stayed at 90% and EHR incr to 117 bpm.  W#ith gait on RA sats dropped to 86% at 115 bpm, but quickly returned to 90% once resting in standing.  Walking back to room sats dropped to 89% at 115 bpm.    Exercises        Assessment/Plan    PT Assessment Patent does not need any further PT services  PT Diagnosis Other (comment) (decr activity tolerance)  PT Problem List    PT Treatment Interventions     PT Goals (Current goals can be found in the Care Plan section) Acute Rehab PT Goals Patient Stated Goal: got to get back to work. PT Goal Formulation: All assessment and education complete, DC therapy    Frequency     Barriers to discharge        Co-evaluation               End of Session   Activity Tolerance: Patient tolerated treatment well Patient left: in chair;with call bell/phone within reach Nurse Communication: Mobility status         Time: 4098-1191 PT Time Calculation (min) (ACUTE ONLY): 27 min   Charges:   PT Evaluation $Initial PT Evaluation Tier I: 1 Procedure PT Treatments $Gait Training: 8-22 mins   PT G Codes:        Merrel Crabbe, Eliseo Gum 11/05/2014, 5:08 PM 11/05/2014  [place] Bing, PT [phone]  (pager)

## 2014-11-05 NOTE — Progress Notes (Signed)
Inpatient Diabetes Program Recommendations  AACE/ADA: New Consensus Statement on Inpatient Glycemic Control (2013)  Target Ranges:  Prepandial:   less than 140 mg/dL      Peak postprandial:   less than 180 mg/dL (1-2 hours)      Critically ill patients:  140 - 180 mg/dL  Results for Calvin Ramos, Calvin Ramos (MRN [id number]) as of 11/05/2014 07:50  Ref. Range 11/02/2014 19:30  Hemoglobin A1C Latest Range: 4.8-5.6 % 6.6 (H)    Reason for assessment- new diagnosis of diabetes  Diabetes history: none Outpatient Diabetes medications: none Current orders for Inpatient glycemic control: Novolog 0-9 units tid with meals, 0-5 units qhs  Ordered Living Well with Diabetes book and diabetes videos to be watched by the patient.  Will visit with him later today.  Susette RacerJulie Marlina Cataldi, RN, BA, MHA, CDE Diabetes Coordinator Inpatient Diabetes Program  [phone] (Team Pager) [phone] Patrcia Dolly([place] Office) 11/05/2014 7:54 AM

## 2014-11-05 NOTE — Progress Notes (Signed)
SATURATION QUALIFICATIONS: (This note is used to comply with regulatory documentation for home oxygen)  Patient Saturations on Room Air at Rest = 91%  Patient Saturations on Room Air while Ambulating =87%  Patient Saturations on 2 Liters of oxygen while Ambulating = 92%  Please briefly explain why patient needs home oxygen: 

## 2014-11-05 NOTE — Plan of Care (Addendum)
Problem: Food- and Nutrition-Related Knowledge Deficit (NB-1.1) Goal: Nutrition education Formal process to instruct or train a patient/client in a skill or to impart knowledge to help patients/clients voluntarily manage or modify food choices and eating behavior to maintain or improve health.  Outcome: Completed/Met Date Met:  11/05/14  RD consulted for nutrition education regarding diabetes.   Per pt he drinks regular ginger ale sometimes. His meals are Breakfast at 9:30, lunch at 12, dinner at 7 pm Pt is very quiet but seemed attentive. Pt agreeable to outpatient classes.     Lab Results  Component Value Date    HGBA1C 6.6* 11/02/2014    RD provided "Carbohydrate Counting for People with Diabetes" handout from the Academy of Nutrition and Dietetics. Discussed different food groups and their effects on blood sugar, emphasizing carbohydrate-containing foods. Provided list of carbohydrates and recommended serving sizes of common foods.  Discussed importance of controlled and consistent carbohydrate intake throughout the day. Provided examples of ways to balance meals/snacks and encouraged intake of high-fiber, whole grain complex carbohydrates. Teach back method used.  Expect fair compliance.  Body mass index is 56.9 kg/(m^2). Pt meets criteria for class III obesity based on current BMI.  Current diet order is CHO Modified, patient is consuming approximately 100% of meals at this time. Labs and medications reviewed. No further nutrition interventions warranted at this time. RD contact information provided. If additional nutrition issues arise, please re-consult RD.  [place], [place], [place] Pager [phone] After Hours Pager

## 2014-11-05 NOTE — Progress Notes (Signed)
SATURATION QUALIFICATIONS: (This note is used to comply with regulatory documentation for home oxygen)  Patient Saturations on Room Air at Rest = 90%  Patient Saturations on Room Air while Ambulating = 86%, but quick recovery to 90% and worsened by wearing a mask.  Patient Saturations on  Liters of oxygen while Ambulating = NA%  Please briefly explain why patient needs home oxygen:pt can manage in a homelike environment without supplement oxygen as he recovers quickly. 11/05/2014  [place] BingKen Lariyah Shetterly, PT [phone]  (pager)

## 2014-11-05 NOTE — Progress Notes (Signed)
SUBJECTIVE:  Denies dizziness or chest pain  OBJECTIVE:   Vitals:   Filed Vitals:   11/04/14 2026 11/04/14 2301 11/05/14 0222 11/05/14 0644  BP:  159/90  129/76  Pulse:  95  79  Temp:  98.5 F (36.9 C)  97.8 F (36.6 C)  TempSrc:  Oral  Oral  Resp:  18  18  Height:      Weight:      SpO2: 93% 94% 94% 97%   I&O's:   Intake/Output Summary (Last 24 hours) at 11/05/14 1610 Last data filed at 11/05/14 9604  Gross per 24 hour  Intake    960 ml  Output    300 ml  Net    660 ml   TELEMETRY: Reviewed telemetry pt in NSR:     PHYSICAL EXAM General: Well developed, well nourished, in no acute distress Head: Eyes PERRLA, No xanthomas.   Normal cephalic and atramatic  Lungs:   Clear bilaterally to auscultation and percussion. Heart:   HRRR S1 S2 Pulses are 2+ & equal. Abdomen: Bowel sounds are positive, abdomen soft and non-tender without masses  Extremities:   No clubbing, cyanosis or edema.  DP +1 Neuro: Alert and oriented X 3. Psych:  Good affect, responds appropriately   LABS: Basic Metabolic Panel:  Recent Labs  54/09/81 0225 11/05/14 0430  NA 138 138  K 3.8 4.4  CL 99 97  CO2 28 27  GLUCOSE 187* 154*  BUN 13 20  CREATININE 1.28 1.36*  CALCIUM 8.1* 8.4  MG  --  2.3   Liver Function Tests:  Recent Labs  11/05/14 0430  AST 56*  ALT 100*  ALKPHOS 63  BILITOT 0.5  PROT 7.2  ALBUMIN 3.2*   No results for input(s): LIPASE, AMYLASE in the last 72 hours. CBC:  Recent Labs  11/03/14 0225 11/05/14 0430  WBC 6.5 13.4*  HGB 13.9 13.0  HCT 42.8 41.3  MCV 79.3 80.0  PLT 330 391   Cardiac Enzymes:  Recent Labs  11/02/14 1901 11/03/14 0225 11/03/14 0635  TROPONINI 0.05* 0.04* 0.03   BNP: Invalid input(s): POCBNP D-Dimer: No results for input(s): DDIMER in the last 72 hours. Hemoglobin A1C:  Recent Labs  11/02/14 1930  HGBA1C 6.6*   Fasting Lipid Panel:  Recent Labs  11/02/14 1930  CHOL 120  HDL 29*  LDLCALC 72  TRIG 94    CHOLHDL 4.1   Thyroid Function Tests: No results for input(s): TSH, T4TOTAL, T3FREE, THYROIDAB in the last 72 hours.  Invalid input(s): FREET3 Anemia Panel: No results for input(s): VITAMINB12, FOLATE, FERRITIN, TIBC, IRON, RETICCTPCT in the last 72 hours. Coag Panel:   No results found for: INR, PROTIME  RADIOLOGY: Dg Chest Port 1 View  11/02/2014   CLINICAL DATA:  Increasing shortness of breath with cough and congestion.  EXAM: PORTABLE CHEST - 1 VIEW  COMPARISON:  None.  FINDINGS: Heart size is upper limits of normal. Central vascular structures are mildly prominent. No focal airspace disease. Patient is mildly rotated towards the right.  IMPRESSION: Slightly prominent lung markings could represent vascular congestion. No focal airspace disease.   Electronically Signed   By: Richarda Overlie M.D.   On: 11/02/2014 15:58    IMPRESSION: 1. Long QT syndrome. Differential diagnosis - congenital, newly discovered: old ecg would be invaluable, no family history of SCD - electrolytes are normal - medication: not on any drugs on admission, but has received macrolides since - ischemia/coronary disease: no symptoms thereof and  limited risk factors (excellent lipid profile except low HDL, diet controlled DM, obesity, maybe mild HBP) - resolving pericarditis related to viral illness: but no pericardial effusion, pericardial rub or pleuritic pain. 2. Nominally abnormal troponin I levels of very doubtful significance  RECOMMENDATION: 1. Outpatient treadmill myoview once respiratory illness has resolved 2. Avoid QT prolonging drugs -macrolide changed to Doxycycline 3.   Recheck EKG today    Quintella ReichertURNER,Victoire Deans R, MD  11/05/2014  9:22 AM

## 2014-11-05 NOTE — Progress Notes (Signed)
[facility] PROGRESS NOTE  Calvin Ramos ZOX:[id number] DOB: 05-11-1956 DOA: 11/02/2014 PCP: No PCP Per Patient Interim summary: 59 y.o male with prior h/o hypertension, asthma, obesity admitted for sob. He was admitted for acute asthma exacerbation.  Assessment/Plan: Acute respiratory failure secondary to asthma exacerbation and possibly early pneumonia. His influenza PCR is positive, but his symptoms started one and half week ago, hence did not start him on tamiflu. currently  he is afebrile and clinically he looks better when compared to yesterday. He is not on oxygen at rest and his oxygen sats on RA ARE 90% and on ambulation the sats are around 87%. So he will still need oxygen on ambulation. Recommend tapering IV solumedrol to po prednisone and continue with duonebs and symbicort.   His blood cultures have been negative so far. His sputum cultures show normal oro pharyngeal flora.  He was initially started on rocephin and zithromax, for his early pneumonia, later on changed to oral doxycycline because of his prolonged QT interval on EKG from last night.we would continue doxycycline for another 3 days to complete the course.  His repeat eKG shows slightly improved QT interval from 500's to 480's . His tachycardia has improved too.    Mildly elevated troponin's on admission:  2 D Echocardiogram ordered, does not show any regional wall abnormalities.  Echo reviewed good LVEF and normal diastolic parameters. His EKG shows diffuse t wave inversions and prolonged qt interval. Cardiology consulted to see if the patient needs stress test evaluation in view of his elevated troponins and abnormal EKG. Calcium and mag levels ordered for this am were well within normal limits . Cardiology recommended outpatient stress test when the respiratory issues have resolved.   Hypertension:  Better controlled today when compared to yesterday. He was started on lisinopril and lasix for his pedal edema.  Lasix  stopped as his renal function is getting worse.   Hypokalemia : replaced as needed.   Diarrhea: resolved. C DIFF PCR is negative. And GI pathogen pcr is also negative.   Abnormal EKG/ Prolonged QT interval on EKG: Stopped zithromax and started on doxycycline.  Electrolytes ordered and within normal limits.  Repeat EKG in am shows improvement in QTc  New diagnosis of Diabetes mellitus: - hgba1c is 6.6. Start him SSI.  Plan to start him on metformin on discharge.  Get diabetes education/ coordinator.  ACE inhibitor started yesterday. But his creatinine is slightly worse today when compared to admission. Would repeat BMP in am , if creatine gets worse, would discontinue lisinopril.    Mild acute renal insufficiency: ? Suspect from using lasix, wills top the lasix and repeat level in am. His potassium is within normal limits and rest of the electrolytes are within normal limits.    Elevated liver function tests: Patient denies any nausea, vomiting or abdominal pain. Unclear etiology. Repeat levels in am, if persistently high, recommend getting [place] ABD .    Leukocytosis: probably from the steroids. I feel his pneumonia is improving. Will repeat labs in am to check wbc count.    Code Status: full code.  Family Communication: none at bedside in am, family at bedside this evening.  Disposition Plan: pending PT eval and possibly home when his asthma improves.    Consultants:  cardiology  Procedures:  none  Antibiotics:  Rocephin and zithromax until 3/3  Doxycycline from 3/3  HPI/Subjective: Today he reports having sob on walking, but at rest no sob or chest pain.  Objective: Filed Vitals:  11/05/14 1411  BP: 150/80  Pulse: 92  Temp: 97.7 F (36.5 C)  Resp: 18    Intake/Output Summary (Last 24 hours) at 11/05/14 1932 Last data filed at 11/05/14 1905  Gross per 24 hour  Intake   1200 ml  Output    400 ml  Net    800 ml   Filed Weights   11/02/14 1321 11/02/14  1906  Weight: 166.527 kg (367 lb 2 oz) 164.837 kg (363 lb 6.4 oz)    Exam:   General:  ALERT afebrile comfortable, not in any distress.   Cardiovascular: s1s2, tachycardic.   Respiratory: no wheezing heard , no rhonchi. Air entry fair.  Abdomen: soft non tender non distended bowel sounds heard  Musculoskeletal: lower extremity edema decreased..   Data Reviewed: Basic Metabolic Panel:  Recent Labs Lab 11/02/14 1327 11/03/14 0225 11/05/14 0430  NA 138 138 138  K 3.4* 3.8 4.4  CL 102 99 97  CO2 GLUCOSE 118* 187* 154*  BUN CREATININE 1.06 1.28 1.36*  CALCIUM 8.0* 8.1* 8.4  MG  --   --  2.3   Liver Function Tests:  Recent Labs Lab 11/05/14 0430  AST 56*  ALT 100*  ALKPHOS 63  BILITOT 0.5  PROT 7.2  ALBUMIN 3.2*   No results for input(s): LIPASE, AMYLASE in the last 168 hours. No results for input(s): AMMONIA in the last 168 hours. CBC:  Recent Labs Lab 11/02/14 1327 11/03/14 0225 11/05/14 0430  WBC 7.2 6.5 13.4*  HGB 14.2 13.9 13.0  HCT 43.6 42.8 41.3  MCV 79.4 79.3 80.0  PLT 321 330 391   Cardiac Enzymes:  Recent Labs Lab 11/02/14 1901 11/03/14 0225 11/03/14 0635  TROPONINI 0.05* 0.04* 0.03   BNP (last 3 results)  Recent Labs  11/02/14 1327  BNP 66.0    ProBNP (last 3 results) No results for input(s): PROBNP in the last 8760 hours.  CBG:  Recent Labs Lab 11/04/14 1702 11/04/14 2258 11/05/14 0803 11/05/14 1209 11/05/14 1657  GLUCAP 155* 141* 145* 140* 122*    Recent Results (from the past 240 hour(s))  Culture, blood (routine x 2)     Status: None (Preliminary result)   Collection Time: 11/02/14  9:34 PM  Result Value Ref Range Status   Specimen Description BLOOD LEFT HAND  Final   Special Requests BOTTLES DRAWN AEROBIC ONLY 7CC  Final   Culture   Final           BLOOD CULTURE RECEIVED NO GROWTH TO DATE CULTURE WILL BE HELD FOR 5 DAYS BEFORE ISSUING A FINAL NEGATIVE REPORT Performed at [facility]    Report Status PENDING  Incomplete  Culture, blood (routine x 2)     Status: None (Preliminary result)   Collection Time: 11/02/14  9:49 PM  Result Value Ref Range Status   Specimen Description BLOOD LEFT HAND  Final   Special Requests BOTTLES DRAWN AEROBIC ONLY 5CC  Final   Culture   Final           BLOOD CULTURE RECEIVED NO GROWTH TO DATE CULTURE WILL BE HELD FOR 5 DAYS BEFORE ISSUING A FINAL NEGATIVE REPORT Performed at [facility]    Report Status PENDING  Incomplete  Ova and parasite examination     Status: None   Collection Time: 11/03/14  2:04 AM  Result Value Ref Range Status   Specimen Description STOOL  Final   Special  Requests NONE  Final   Ova and parasites   Final    NO OVA OR PARASITES SEEN Performed at [facility]    Report Status 11/04/2014 FINAL  Final  Stool culture     Status: None (Preliminary result)   Collection Time: 11/03/14  2:04 AM  Result Value Ref Range Status   Specimen Description STOOL  Final   Special Requests NONE  Final   Culture   Final    NO SUSPICIOUS COLONIES, CONTINUING TO HOLD Performed at [facility]    Report Status PENDING  Incomplete  Clostridium Difficile by PCR     Status: None   Collection Time: 11/03/14  2:04 AM  Result Value Ref Range Status   C difficile by pcr NEGATIVE NEGATIVE Final  Culture, expectorated sputum-assessment     Status: None   Collection Time: 11/03/14  2:04 AM  Result Value Ref Range Status   Specimen Description SPUTUM  Final   Special Requests NONE  Final   Sputum evaluation   Final    THIS SPECIMEN IS ACCEPTABLE. RESPIRATORY CULTURE REPORT TO FOLLOW.   Report Status 11/03/2014 FINAL  Final  Culture, respiratory (NON-Expectorated)     Status: None   Collection Time: 11/03/14  2:04 AM  Result Value Ref Range Status   Specimen Description SPUTUM  Final   Special Requests NONE  Final   Gram Stain   Final    ABUNDANT WBC PRESENT,BOTH PMN AND MONONUCLEAR RARE  SQUAMOUS EPITHELIAL CELLS PRESENT FEW GRAM NEGATIVE RODS FEW GRAM POSITIVE COCCI IN PAIRS IN CHAINS RARE GRAM POSITIVE RODS    Culture   Final    NORMAL OROPHARYNGEAL FLORA Performed at [facility]    Report Status 11/05/2014 FINAL  Final     Studies: No results found.  Scheduled Meds: . benzonatate  100 mg Oral TID  . budesonide-formoterol  2 puff Inhalation BID  . doxycycline  100 mg Oral Q12H  . enoxaparin (LOVENOX) injection  80 mg Subcutaneous Q24H  . furosemide  20 mg Oral Daily  . insulin aspart  0-5 Units Subcutaneous QHS  . insulin aspart  0-9 Units Subcutaneous TID WC  . ipratropium-albuterol  3 mL Nebulization Q6H  . lisinopril  2.5 mg Oral Daily  . methylPREDNISolone (SOLU-MEDROL) injection  40 mg Intravenous Q12H  . potassium chloride  40 mEq Oral Once  . sodium chloride  3 mL Intravenous Q12H   Continuous Infusions:   Principal Problem:   Acute respiratory failure Active Problems:   Acute asthma exacerbation   Dyspnea   Obesity   Essential hypertension   Hypokalemia   Abnormal EKG   QT prolongation    Time spent: 25 MINUTES    [facility],Nyree Applegate  [facility] Pager [phone]. If 7PM-7AM, please contact night-coverage at [email or web address], password [email or web address] 11/05/2014, 7:32 PM  LOS: 2 days

## 2014-11-05 NOTE — Progress Notes (Signed)
Inpatient Diabetes Program Recommendations  AACE/ADA: New Consensus Statement on Inpatient Glycemic Control (2013)  Target Ranges:  Prepandial:   less than 140 mg/dL      Peak postprandial:   less than 180 mg/dL (1-2 hours)      Critically ill patients:  140 - 180 mg/dL   Results for Meissner, Tremond (MRN [id number]) as of 11/05/2014 14:32  Ref. Range 11/04/2014 12:09 11/04/2014 17:02 11/04/2014 22:58 11/05/2014 08:03 11/05/2014 12:09  Glucose-Capillary Latest Range: 70-99 mg/dL 409155 (H) 811155 (H) 914141 (H) 145 (H) 140 (H)    Reason for Visit: new diagnosis  Diabetes history: none Outpatient Diabetes medications: none Current orders for Inpatient glycemic control: Novolog 0-9 units tid with meals, Novolog 0-5 units qhs    Spoke with patient at his bedside- RD was just leaving and had set goals with him.  RN told me patient was feeling very overwhelmed with all the information so I [facility] the [facility]- this will allow him to get the care he needs, get meds if they are required and get testing supplies if the doctor thinks it is necessary.  Patient appeared to be listening- answered questions when asked but not engaged in asking questions.  Patient has begun to watch the videos I recommended but has not been through the living well with diabetes notebook.   Susette RacerJulie Kaleen Rochette, RN, BA, MHA, CDE Diabetes Coordinator Inpatient Diabetes Program  [phone] (Team Pager) [phone] Patrcia Dolly([place] Office) 11/05/2014 2:40 PM

## 2014-11-06 LAB — COMPREHENSIVE METABOLIC PANEL
ALK PHOS: 63 U/L (ref 39–117)
ALT: 103 U/L — ABNORMAL HIGH (ref 0–53)
AST: 59 U/L — AB (ref 0–37)
Albumin: 3 g/dL — ABNORMAL LOW (ref 3.5–5.2)
Anion gap: 8 (ref 5–15)
BUN: 21 mg/dL (ref 6–23)
CO2: 28 mmol/L (ref 19–32)
Calcium: 8.2 mg/dL — ABNORMAL LOW (ref 8.4–10.5)
Chloride: 102 mmol/L (ref 96–112)
Creatinine, Ser: 1.14 mg/dL (ref 0.50–1.35)
GFR calc Af Amer: 81 mL/min — ABNORMAL LOW (ref >90)
GFR calc non Af Amer: 70 mL/min — ABNORMAL LOW (ref >90)
GLUCOSE: 122 mg/dL — AB (ref 70–99)
POTASSIUM: 3.9 mmol/L (ref 3.5–5.1)
Sodium: 138 mmol/L (ref 135–145)
Total Bilirubin: 0.6 mg/dL (ref 0.3–1.2)
Total Protein: 6.5 g/dL (ref 6.0–8.3)

## 2014-11-06 LAB — CBC
HCT: 41.4 % (ref 39.0–52.0)
Hemoglobin: 13.4 g/dL (ref 13.0–17.0)
MCH: 25.8 pg — ABNORMAL LOW (ref 26.0–34.0)
MCHC: 32.4 g/dL (ref 30.0–36.0)
MCV: 79.6 fL (ref 78.0–100.0)
PLATELETS: 349 10*3/uL (ref 150–400)
RBC: 5.2 MIL/uL (ref 4.22–5.81)
RDW: 14.8 % (ref 11.5–15.5)
WBC: 13.1 10*3/uL — ABNORMAL HIGH (ref 4.0–10.5)

## 2014-11-06 LAB — GLUCOSE, CAPILLARY
GLUCOSE-CAPILLARY: 118 mg/dL — AB (ref 70–99)
Glucose-Capillary: 91 mg/dL (ref 70–99)
Glucose-Capillary: 181 mg/dL — ABNORMAL HIGH (ref 70–99)
Glucose-Capillary: 155 mg/dL — ABNORMAL HIGH (ref 70–99)

## 2014-11-06 NOTE — Progress Notes (Signed)
[facility] PROGRESS NOTE  Calvin Ramos ZOX:[id number] DOB: 1956-07-13 DOA: 11/02/2014 PCP: No PCP Per Patient Interim summary: 59 y.o male with prior h/o hypertension, asthma, obesity admitted for sob. He was admitted for acute asthma exacerbation, was found to be flu positive and has early community acquired pneumonia.  Assessment/Plan: Acute respiratory failure secondary to asthma exacerbation and possibly early pneumonia. His influenza PCR is positive, but his symptoms started one and half week ago, hence did not start him on tamiflu. currently   He is not on oxygen at rest and his oxygen sats on RA ARE 90% and on ambulation the sats are around 87% fro PT EVAL yesterday.  So he will still need oxygen on ambulation.   He reports worsening sob on walking a few feet in the room. Hence he will be observed on oral prednisone and  Doxycycline for another 24 hours and possibly discharged in am.    His blood cultures have been negative so far. His sputum cultures show normal oro pharyngeal flora.     Mildly elevated troponin's on admission:  2 D Echocardiogram ordered, does not show any regional wall abnormalities.  Echo reviewed good LVEF and normal diastolic parameters. His EKG shows diffuse t wave inversions and prolonged qt interval. Cardiology consulted to see if the patient needs stress test evaluation in view of his elevated troponins and abnormal EKG. Calcium and mag levels ordered for this am were well within normal limits . Cardiology recommended outpatient stress test when the respiratory issues have resolved.   Hypertension:  Controlled.  He was started on lisinopril and lasix for his pedal edema.   Lasix stopped as his renal function is getting worse.   Hypokalemia : replaced as needed.   Diarrhea: resolved. C DIFF PCR is negative. And GI pathogen pcr is also negative.   Abnormal EKG/ Prolonged QT interval on EKG: Stopped zithromax and started on doxycycline.  Electrolytes  ordered and within normal limits.  Repeat EKG in am shows improvement in QTc  New diagnosis of Diabetes mellitus: - hgba1c is 6.6. Start him SSI.  Plan to start him on metformin on discharge.  Get diabetes education/ coordinator.  ACE inhibitor started.    Mild acute renal insufficiency: Resolved.   He was initially started on lasix but was stopped as his renal function worsened.  His potassium is within normal limits and rest of the electrolytes are within normal limits.    Elevated liver function tests: Patient denies any nausea, vomiting or abdominal pain. Unclear etiology . US abdomen ordered . Repeat levels are persistently high.   Leukocytosis: probably from the steroids.leukocytosis is stable  Around 13,000. /    Code Status: full code.  Family Communication: none at bedside in am Disposition Plan: possibly home when his asthma improves.    Consultants:  Cardiology  Physical therapy.   Procedures:  none  Antibiotics:  Rocephin and zithromax until 3/3  Doxycycline from 3/3  HPI/Subjective: Persistent sob on exertion.  Objective: Filed Vitals:   11/06/14 1523  BP: 140/79  Pulse: 85  Temp: 99 F (37.2 C)  Resp: 20    Intake/Output Summary (Last 24 hours) at 11/06/14 1827 Last data filed at 11/05/14 1905  Gross per 24 hour  Intake    240 ml  Output      0 ml  Net    240 ml   Filed Weights   11/02/14 1321 11/02/14 1906  Weight: 166.527 kg (367 lb 2 oz) 164.837 kg (363  lb 6.4 oz)    Exam:   General:  ALERT afebrile comfortable, not in any distress.   Cardiovascular: s1s2,RRR,   Respiratory: no wheezing heard , no rhonchi. Air entry fair.  Abdomen: soft non tender non distended bowel sounds heard  Musculoskeletal: lower extremity edema decreased.Marland Kitchen.   NEURO: alert and oriented. No focal deficits.   Data Reviewed: Basic Metabolic Panel:  Recent Labs Lab 11/02/14 1327 11/03/14 0225 11/05/14 0430 11/06/14 0343  NA 138 138 138 138   K 3.4* 3.8 4.4 3.9  CL 102 99 97 102  CO2 28 28 27 28   GLUCOSE 118* 187* 154* 122*  BUN 9 13 20 21   CREATININE 1.06 1.28 1.36* 1.14  CALCIUM 8.0* 8.1* 8.4 8.2*  MG  --   --  2.3  --    Liver Function Tests:  Recent Labs Lab 11/05/14 0430 11/06/14 0343  AST 56* 59*  ALT 100* 103*  ALKPHOS 63 63  BILITOT 0.5 0.6  PROT 7.2 6.5  ALBUMIN 3.2* 3.0*   No results for input(s): LIPASE, AMYLASE in the last 168 hours. No results for input(s): AMMONIA in the last 168 hours. CBC:  Recent Labs Lab 11/02/14 1327 11/03/14 0225 11/05/14 0430 11/06/14 0343  WBC 7.2 6.5 13.4* 13.1*  HGB 14.2 13.9 13.0 13.4  HCT 43.6 42.8 41.3 41.4  MCV 79.4 79.3 80.0 79.6  PLT 321 330 391 349   Cardiac Enzymes:  Recent Labs Lab 11/02/14 1901 11/03/14 0225 11/03/14 0635  TROPONINI 0.05* 0.04* 0.03   BNP (last 3 results)  Recent Labs  11/02/14 1327  BNP 66.0    ProBNP (last 3 results) No results for input(s): PROBNP in the last 8760 hours.  CBG:  Recent Labs Lab 11/05/14 1657 11/05/14 2148 11/06/14 0748 11/06/14 1216 11/06/14 1719  GLUCAP 122* 165* 91 118* 181*    Recent Results (from the past 240 hour(s))  Culture, blood (routine x 2)     Status: None (Preliminary result)   Collection Time: 11/02/14  9:34 PM  Result Value Ref Range Status   Specimen Description BLOOD LEFT HAND  Final   Special Requests BOTTLES DRAWN AEROBIC ONLY 7CC  Final   Culture   Final           BLOOD CULTURE RECEIVED NO GROWTH TO DATE CULTURE WILL BE HELD FOR 5 DAYS BEFORE ISSUING A FINAL NEGATIVE REPORT [facility]formed at [facility]    Report Status PENDING  Incomplete  Culture, blood (routine x 2)     Status: None (Preliminary result)   Collection Time: 11/02/14  9:49 PM  Result Value Ref Range Status   Specimen Description BLOOD LEFT HAND  Final   Special Requests BOTTLES DRAWN AEROBIC ONLY 5CC  Final   Culture   Final           BLOOD CULTURE RECEIVED NO GROWTH TO DATE CULTURE WILL BE  HELD FOR 5 DAYS BEFORE ISSUING A FINAL NEGATIVE REPORT Performed at [facility]    Report Status PENDING  Incomplete  Ova and parasite examination     Status: None   Collection Time: 11/03/14  2:04 AM  Result Value Ref Range Status   Specimen Description STOOL  Final   Special Requests NONE  Final   Ova and parasites   Final    NO OVA OR PARASITES SEEN Performed at [facility]    Report Status 11/04/2014 FINAL  Final  Stool culture     Status: None (Preliminary  result)   Collection Time: 11/03/14  2:04 AM  Result Value Ref Range Status   Specimen Description STOOL  Final   Special Requests NONE  Final   Culture   Final    NO SUSPICIOUS COLONIES, CONTINUING TO HOLD Performed at [facility]    Report Status PENDING  Incomplete  Clostridium Difficile by PCR     Status: None   Collection Time: 11/03/14  2:04 AM  Result Value Ref Range Status   C difficile by pcr NEGATIVE NEGATIVE Final  Culture, expectorated sputum-assessment     Status: None   Collection Time: 11/03/14  2:04 AM  Result Value Ref Range Status   Specimen Description SPUTUM  Final   Special Requests NONE  Final   Sputum evaluation   Final    THIS SPECIMEN IS ACCEPTABLE. RESPIRATORY CULTURE REPORT TO FOLLOW.   Report Status 11/03/2014 FINAL  Final  Culture, respiratory (NON-Expectorated)     Status: None   Collection Time: 11/03/14  2:04 AM  Result Value Ref Range Status   Specimen Description SPUTUM  Final   Special Requests NONE  Final   Gram Stain   Final    ABUNDANT WBC PRESENT,BOTH PMN AND MONONUCLEAR RARE SQUAMOUS EPITHELIAL CELLS PRESENT FEW GRAM NEGATIVE RODS FEW GRAM POSITIVE COCCI IN PAIRS IN CHAINS RARE GRAM POSITIVE RODS    Culture   Final    NORMAL OROPHARYNGEAL FLORA Performed at [facility]    Report Status 11/05/2014 FINAL  Final     Studies: No results found.  Scheduled Meds: . benzonatate  100 mg Oral TID  . budesonide-formoterol  2 puff  Inhalation BID  . doxycycline  100 mg Oral Q12H  . enoxaparin (LOVENOX) injection  80 mg Subcutaneous Q24H  . insulin aspart  0-5 Units Subcutaneous QHS  . insulin aspart  0-9 Units Subcutaneous TID WC  . ipratropium-albuterol  3 mL Nebulization Q6H  . lisinopril  2.5 mg Oral Daily  . potassium chloride  40 mEq Oral Once  . predniSONE  60 mg Oral Q breakfast  . sodium chloride  3 mL Intravenous Q12H   Continuous Infusions:   Principal Problem:   Acute respiratory failure Active Problems:   Acute asthma exacerbation   Dyspnea   Obesity   Essential hypertension   Hypokalemia   Abnormal EKG   QT prolongation    Time spent: 25 MINUTES    [facility]  [facility] Pager [phone]. If 7PM-7AM, please contact night-coverage at [email or web address], password [facility] 11/06/2014, 6:27 PM  LOS: 3 days

## 2014-11-07 ENCOUNTER — Inpatient Hospital Stay (HOSPITAL_COMMUNITY): Payer: Self-pay

## 2014-11-07 LAB — STOOL CULTURE

## 2014-11-07 LAB — GLUCOSE, CAPILLARY
Glucose-Capillary: 120 mg/dL — ABNORMAL HIGH (ref 70–99)
Glucose-Capillary: 111 mg/dL — ABNORMAL HIGH (ref 70–99)
Glucose-Capillary: 132 mg/dL — ABNORMAL HIGH (ref 70–99)

## 2014-11-07 MED ORDER — ALBUTEROL SULFATE (2.5 MG/3ML) 0.083% IN NEBU: 2.5000 mg | INHALATION_SOLUTION | RESPIRATORY_TRACT | Status: AC | PRN

## 2014-11-07 MED ORDER — PREDNISONE 20 MG PO TABS: ORAL_TABLET | ORAL | Status: AC

## 2014-11-07 MED ORDER — IPRATROPIUM-ALBUTEROL 0.5-2.5 (3) MG/3ML IN SOLN
3.0000 mL | Freq: Two times a day (BID) | RESPIRATORY_TRACT | Status: DC
  Administered 2014-11-07: 3 mL via RESPIRATORY_TRACT
  Filled 2014-11-07 (×2): qty 3

## 2014-11-07 MED ORDER — LISINOPRIL 2.5 MG PO TABS: 2.5000 mg | ORAL_TABLET | Freq: Every day | ORAL | Status: AC

## 2014-11-07 MED ORDER — GUAIFENESIN-DM 100-10 MG/5ML PO SYRP: 5.0000 mL | ORAL_SOLUTION | ORAL | Status: AC | PRN

## 2014-11-07 MED ORDER — IPRATROPIUM-ALBUTEROL 0.5-2.5 (3) MG/3ML IN SOLN: 3.0000 mL | Freq: Four times a day (QID) | RESPIRATORY_TRACT | Status: AC | PRN

## 2014-11-07 MED ORDER — DOXYCYCLINE HYCLATE 100 MG PO TABS: 100.0000 mg | ORAL_TABLET | Freq: Two times a day (BID) | ORAL | Status: AC

## 2014-11-07 MED ORDER — BUDESONIDE-FORMOTEROL FUMARATE 80-4.5 MCG/ACT IN AERO: 2.0000 | INHALATION_SPRAY | Freq: Two times a day (BID) | RESPIRATORY_TRACT | Status: AC

## 2014-11-07 MED ORDER — METFORMIN HCL 500 MG PO TABS: 500.0000 mg | ORAL_TABLET | Freq: Two times a day (BID) | ORAL | Status: AC

## 2014-11-07 NOTE — Progress Notes (Signed)
Patient states family will be here in 1-1.5 hours for discharge.

## 2014-11-07 NOTE — Progress Notes (Signed)
HO Akula paged to let her know patient ambulated 13700ft on RA, and post ambulation his SPo2 was 95% on RA.

## 2014-11-07 NOTE — Progress Notes (Signed)
NURSING PROGRESS NOTE  Calvin DubinLuster Ramos [id number] Discharge Data: 11/07/2014 6:42 PM Attending Provider: Kathlen ModyVijaya Akula, MD PCP:No PCP Per Patient   Calvin DubinLuster Ramos to be D/C'd Home per MD order.    All IV's will be discontinued and monitored for bleeding.  All belongings will be returned to patient for patient to take home.  Patient sent home with hard copies of all prescriptions, and verbalized he understood d/c instructions to book appointments with primary care and cardiology of which are listed with the phone numbers on his d/c instructions.  Last Documented Vital Signs:  Blood pressure 143/78, pulse 81, temperature 99 F (37.2 C), temperature source Oral, resp. rate 16, height 5\' 7"  (1.702 m), weight 164.837 kg (363 lb 6.4 oz), SpO2 95 %.  Leane PlattSpencer Oliverio Cho RN, BS, BSN

## 2014-11-07 NOTE — Discharge Summary (Signed)
Physician Discharge Summary  Calvin Ramos AVW:[id number] DOB: August 21, 1956 DOA: 11/02/2014  PCP: No PCP Per Patient  Admit date: 11/02/2014 Discharge date: 11/07/2014  Time spent: 30 minutes  Recommendations for Outpatient Follow-up:  1. Please check liver function testsi n 4 weeks.  2. Please follow up with PCP as recommended 3. Please follow u with cardiology for outpatient stress test.   Discharge Diagnoses:  Principal Problem:   Acute respiratory failure Active Problems:   Acute asthma exacerbation   Dyspnea   Obesity   Essential hypertension   Hypokalemia   Abnormal EKG   QT prolongation   Discharge Condition: improved  Diet recommendation: carb modified diet.   Filed Weights   11/02/14 1321 11/02/14 1906  Weight: 166.527 kg (367 lb 2 oz) 164.837 kg (363 lb 6.4 oz)    History of present illness:  59 y.o male with prior h/o hypertension, asthma, obesity admitted for sob. He was admitted for acute asthma exacerbation, was found to be flu positive and has early community acquired pneumonia.   Hospital Course:   Acute respiratory failure secondary to asthma exacerbation and possibly early pneumonia. His influenza PCR is positive, but his symptoms started one and half week ago, hence did not start him on tamiflu. His oxygen sats on ambulation have been well above 95%.  His blood cultures have been negative so far. His sputum cultures show normal oro pharyngeal flora. He will finish the prednisone taper and oral antibiotics .     Mildly elevated troponin's on admission:  2 D Echocardiogram ordered, does not show any regional wall abnormalities. Echo reviewed good LVEF and normal diastolic parameters. His EKG shows diffuse t wave inversions and prolonged qt interval. Cardiology consulted to see if the patient needs stress test evaluation in view of his elevated troponins and abnormal EKG. Calcium and mag levels ordered for this am were well within normal limits . Cardiology  recommended outpatient stress test when the respiratory issues have resolved.   Hypertension:  Controlled. He was started on lisinopril and lasix for his pedal edema.  Lasix stopped as his renal function is getting worse. Discharge on lisinopril  Hypokalemia : replaced as needed.   Diarrhea: resolved. C DIFF PCR is negative. And GI pathogen pcr is also negative.   Abnormal EKG/ Prolonged QT interval on EKG: Stopped zithromax and started on doxycycline.  Electrolytes ordered and within normal limits.  Repeat EKG in am shows improvement in QTc  New diagnosis of Diabetes mellitus: - hgba1c is 6.6. Start him SSI.  Plan to start him on metformin on discharge.  Ordered.  diabetes Biochemist, clinical.  ACE inhibitor started.    Mild acute renal insufficiency: Resolved.   He was initially started on lasix but was stopped as his renal function worsened.  His potassium is within normal limits and rest of the electrolytes are within normal limits.    Elevated liver function tests: Patient denies any nausea, vomiting or abdominal pain. Unclear etiology . US abdomen ordered and is negative for acute pathology. Recommend outpatient follow upwin 4 weeks.  Leukocytosis: probably from the steroids.leukocytosis is stable Around 13,000. /  Procedures:  echocardiogram  Consultations:  cardiology  Discharge Exam: Filed Vitals:   11/07/14 1251  BP: 143/78  Pulse: 81  Temp: 99 F (37.2 C)  Resp: 16    General: alert afebrile comfortable Cardiovascular: s1s2 Respiratory: ctab  Discharge Instructions   Discharge Instructions    Diet - low sodium heart healthy    Complete by:  As directed      Discharge instructions    Complete by:  As directed   Follow u with PCP in 2 weeks. Follow up with cardiology as recommended.          Current Discharge Medication List    START taking these medications   Details  albuterol (PROVENTIL) (2.5 MG/3ML) 0.083% nebulizer  solution Take 3 mLs (2.5 mg total) by nebulization every 4 (four) hours as needed for wheezing or shortness of breath. Qty: 75 mL, Refills: 1    budesonide-formoterol (SYMBICORT) 80-4.5 MCG/ACT inhaler Inhale 2 puffs into the lungs 2 (two) times daily. Qty: 1 Inhaler, Refills: 1    doxycycline (VIBRA-TABS) 100 MG tablet Take 1 tablet (100 mg total) by mouth every 12 (twelve) hours. Qty: 2 tablet, Refills: 0    guaiFENesin-dextromethorphan (ROBITUSSIN DM) 100-10 MG/5ML syrup Take 5 mLs by mouth every 4 (four) hours as needed for cough. Qty: 118 mL, Refills: 0    ipratropium-albuterol (DUONEB) 0.5-2.5 (3) MG/3ML SOLN Take 3 mLs by nebulization every 6 (six) hours as needed. Qty: 360 mL, Refills: 1    lisinopril (PRINIVIL,ZESTRIL) 2.5 MG tablet Take 1 tablet (2.5 mg total) by mouth daily. Qty: 30 tablet, Refills: 1    metFORMIN (GLUCOPHAGE) 500 MG tablet Take 1 tablet (500 mg total) by mouth 2 (two) times daily with a meal. Qty: 60 tablet, Refills: 1    predniSONE (DELTASONE) 20 MG tablet Prednisone 40 mg daily for 3 days followed by  Prednisone 20 mg daily for 3 days and stop. Qty: 12 tablet, Refills: 0       No Known Allergies Follow-up Information    Follow up with [facility]     On 11/10/2014.   Why:  2 pm  for hospital follow up , bring $20 co pay and photo id   Contact information:   [address] [place] [address] [phone]      Follow up with [facility]. Schedule an appointment as soon as possible for a visit in 1 week.   Contact information:   [address] [place] [address] [phone]       The results of significant diagnostics from this hospitalization (including imaging, microbiology, ancillary and laboratory) are listed below for reference.    Significant Diagnostic Studies: US Abdomen Complete  11/07/2014   CLINICAL DATA:  Elevated liver function tests.   EXAM: ULTRASOUND ABDOMEN COMPLETE  COMPARISON:  None.  FINDINGS: Technically difficult exam due to patient habitus and immobility.  Gallbladder: 1 or 2 tiny 3 mm nonmobile echogenic foci are seen, suspicious for tiny polyps. No definite gallstones identified. No evidence of gallbladder wall thickening. No sonographic Murphy sign noted by sonographer.  Common bile duct: Diameter: 4 mm, which is within normal limits  Liver: No focal lesion identified. Within normal limits in parenchymal echogenicity.  IVC: No abnormality visualized.  Pancreas: Visualized portion unremarkable.  Spleen: Size and appearance within normal limits.  Right Kidney: Length: 10.5 cm. Echogenicity within normal limits. No mass or hydronephrosis visualized.  Left Kidney: Length: 10.7 cm. Echogenicity within normal limits. No mass or hydronephrosis visualized.  Abdominal aorta: No aneurysm visualized.  Other findings: None.  IMPRESSION: Technically difficult exam. No definite gallstones, biliary dilatation, or other significant abnormality identified.   Electronically Signed   By: Myles Rosenthal M.D.   On: 11/07/2014 09:04   Dg Chest Port 1 View  11/02/2014   CLINICAL DATA:  Increasing shortness of breath with cough and congestion.  EXAM: PORTABLE CHEST - 1 VIEW  COMPARISON:  None.  FINDINGS: Heart size is upper limits of normal. Central vascular structures are mildly prominent. No focal airspace disease. Patient is mildly rotated towards the right.  IMPRESSION: Slightly prominent lung markings could represent vascular congestion. No focal airspace disease.   Electronically Signed   By: Richarda OverlieAdam  Henn M.D.   On: 11/02/2014 15:58    Microbiology: Recent Results (from the past 240 hour(s))  Culture, blood (routine x 2)     Status: None (Preliminary result)   Collection Time: 11/02/14  9:34 PM  Result Value Ref Range Status   Specimen Description BLOOD LEFT HAND  Final   Special Requests BOTTLES DRAWN AEROBIC ONLY 7CC  Final   Culture   Final            BLOOD CULTURE RECEIVED NO GROWTH TO DATE CULTURE WILL BE HELD FOR 5 DAYS BEFORE ISSUING A FINAL NEGATIVE REPORT Performed [facility]    Report Status PENDING  Incomplete  Culture, blood (routine x 2)     Status: None (Preliminary result)   Collection Time: 11/02/14  9:49 PM  Result Value Ref Range Status   Specimen Description BLOOD LEFT HAND  Final   Special Requests BOTTLES DRAWN AEROBIC ONLY 5CC  Final   Culture   Final           BLOOD CULTURE RECEIVED NO GROWTH TO DATE CULTURE WILL BE HELD FOR 5 DAYS BEFORE ISSUING A FINAL NEGATIVE REPORT Performed at [facility]    Report Status PENDING  Incomplete  Ova and parasite examination     Status: None   Collection Time: 11/03/14  2:04 AM  Result Value Ref Range Status   Specimen Description STOOL  Final   Special Requests NONE  Final   Ova and parasites   Final    NO OVA OR PARASITES SEEN Performed at [facility]    Report Status 11/04/2014 FINAL  Final  Stool culture     Status: None   Collection Time: 11/03/14  2:04 AM  Result Value Ref Range Status   Specimen Description STOOL  Final   Special Requests NONE  Final   Culture   Final    NO SALMONELLA, SHIGELLA, CAMPYLOBACTER, YERSINIA, OR E.COLI 0157:[id number] ISOLATED Performed at [facility]    Report Status 11/07/2014 FINAL  Final  Clostridium Difficile by PCR     Status: None   Collection Time: 11/03/14  2:04 AM  Result Value Ref Range Status   C difficile by pcr NEGATIVE NEGATIVE Final  Culture, expectorated sputum-assessment     Status: None   Collection Time: 11/03/14  2:04 AM  Result Value Ref Range Status   Specimen Description SPUTUM  Final   Special Requests NONE  Final   Sputum evaluation   Final    THIS SPECIMEN IS ACCEPTABLE. RESPIRATORY CULTURE REPORT TO FOLLOW.   Report Status 11/03/2014 FINAL  Final  Culture, respiratory (NON-Expectorated)     Status: None   Collection Time: 11/03/14  2:04 AM  Result Value Ref  Range Status   Specimen Description SPUTUM  Final   Special Requests NONE  Final   Gram Stain   Final    ABUNDANT WBC PRESENT,BOTH PMN AND MONONUCLEAR RARE SQUAMOUS EPITHELIAL CELLS PRESENT FEW GRAM NEGATIVE RODS FEW GRAM POSITIVE COCCI IN PAIRS IN CHAINS RARE GRAM POSITIVE RODS    Culture  Final    NORMAL OROPHARYNGEAL FLORA Performed at [facility]    Report Status 11/05/2014 FINAL  Final     Labs: Basic Metabolic Panel:  Recent Labs Lab 11/02/14 1327 11/03/14 0225 11/05/14 0430 11/06/14 0343  NA 138 138 138 138  K 3.4* 3.8 4.4 3.9  CL 102 99 97 102  CO2 GLUCOSE 118* 187* 154* 122*  BUN CREATININE 1.06 1.28 1.36* 1.14  CALCIUM 8.0* 8.1* 8.4 8.2*  MG  --   --  2.3  --    Liver Function Tests:  Recent Labs Lab 11/05/14 0430 11/06/14 0343  AST 56* 59*  ALT 100* 103*  ALKPHOS 63 63  BILITOT 0.5 0.6  PROT 7.2 6.5  ALBUMIN 3.2* 3.0*   No results for input(s): LIPASE, AMYLASE in the last 168 hours. No results for input(s): AMMONIA in the last 168 hours. CBC:  Recent Labs Lab 11/02/14 1327 11/03/14 0225 11/05/14 0430 11/06/14 0343  WBC 7.2 6.5 13.4* 13.1*  HGB 14.2 13.9 13.0 13.4  HCT 43.6 42.8 41.3 41.4  MCV 79.4 79.3 80.0 79.6  PLT 321 330 391 349   Cardiac Enzymes:  Recent Labs Lab 11/02/14 1901 11/03/14 0225 11/03/14 0635  TROPONINI 0.05* 0.04* 0.03   BNP: BNP (last 3 results)  Recent Labs  11/02/14 1327  BNP 66.0    ProBNP (last 3 results) No results for input(s): PROBNP in the last 8760 hours.  CBG:  Recent Labs Lab 11/06/14 1216 11/06/14 1719 11/06/14 2128 11/07/14 0946 11/07/14 1201  GLUCAP 118* 181* 155* 120* 111*       Signed:  Alzada Brazee  [facility] 11/07/2014, 3:50 PM

## 2014-11-07 NOTE — Progress Notes (Signed)
Patient ride has arrived, patient ready for d/c.

## 2014-11-09 LAB — CULTURE, BLOOD (ROUTINE X 2)
CULTURE: NO GROWTH
CULTURE: NO GROWTH

## 2014-11-10 ENCOUNTER — Encounter (HOSPITAL_BASED_OUTPATIENT_CLINIC_OR_DEPARTMENT_OTHER): Payer: Self-pay | Admitting: Family Medicine

## 2014-11-10 NOTE — Progress Notes (Signed)
Error encounter. 

## 2016-12-04 IMAGING — US US ABDOMEN COMPLETE
1 series · 14 of 25 positions shown · non-contrast
Comparison: None.

CLINICAL DATA: Elevated liver function tests.

EXAM:
ULTRASOUND ABDOMEN COMPLETE

[Series 1: us abdomen complete · 0.25mm/px · 14 of 86 slices shown]
[im 1/86]
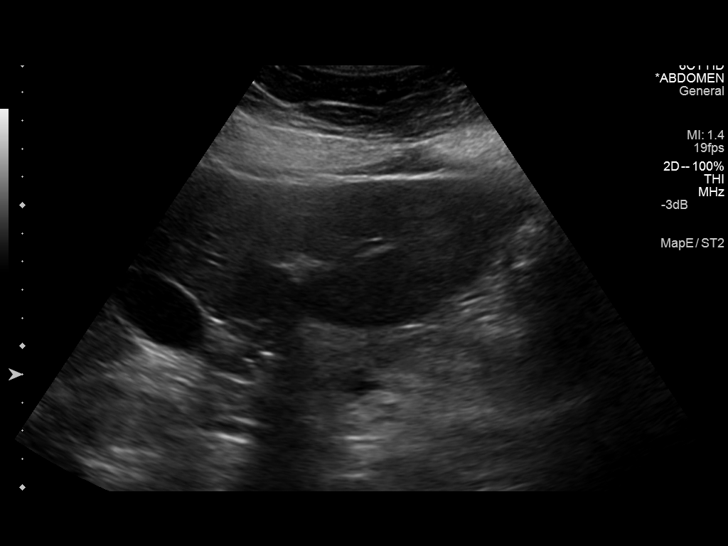
[im 8/86]
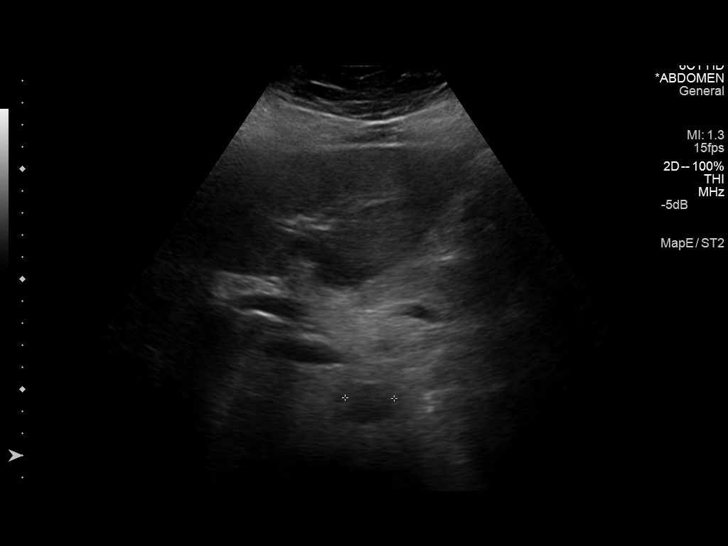
[im 15/86]
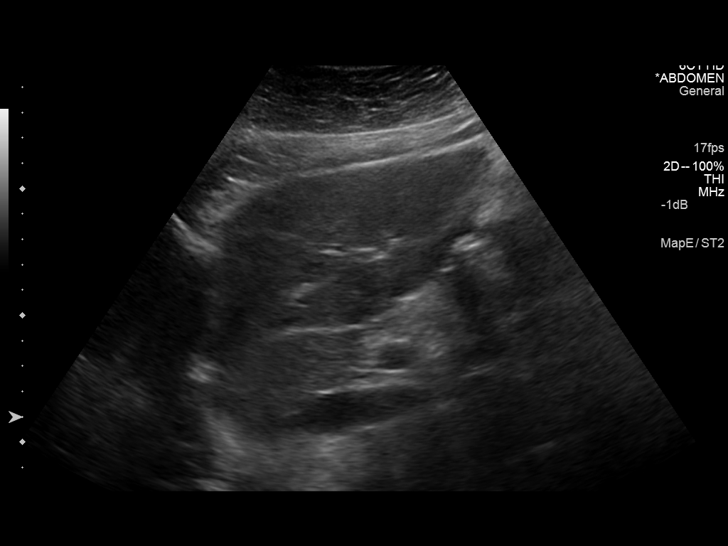
[im 22/86]
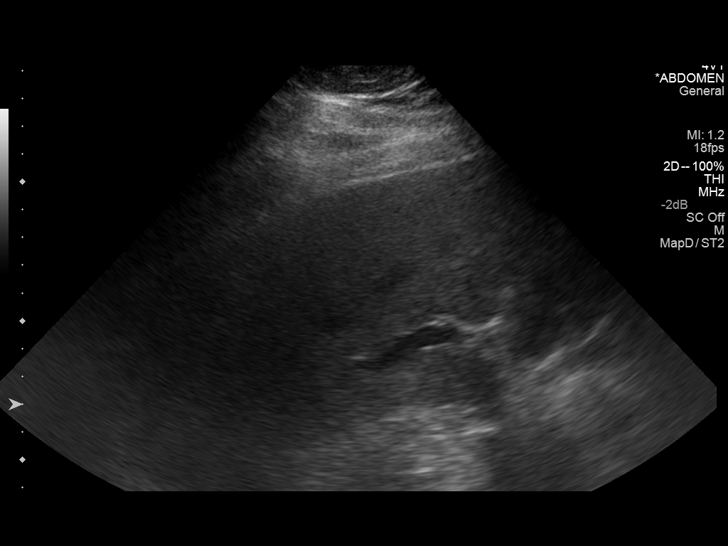
[im 29/86]
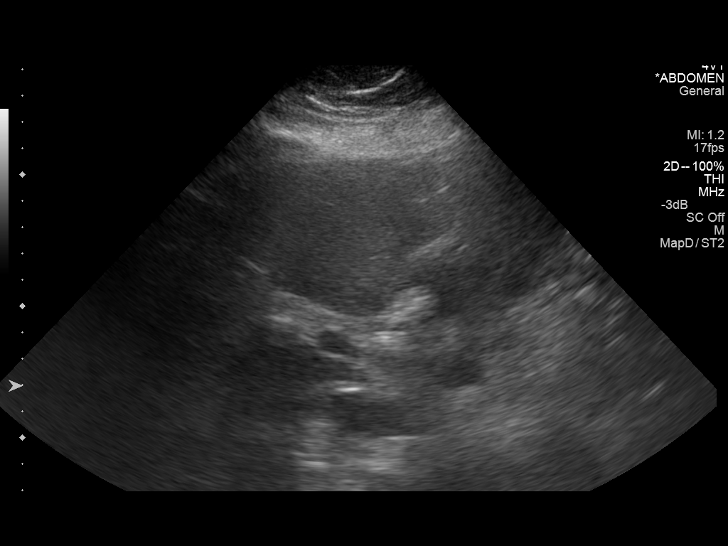
[im 32/86]
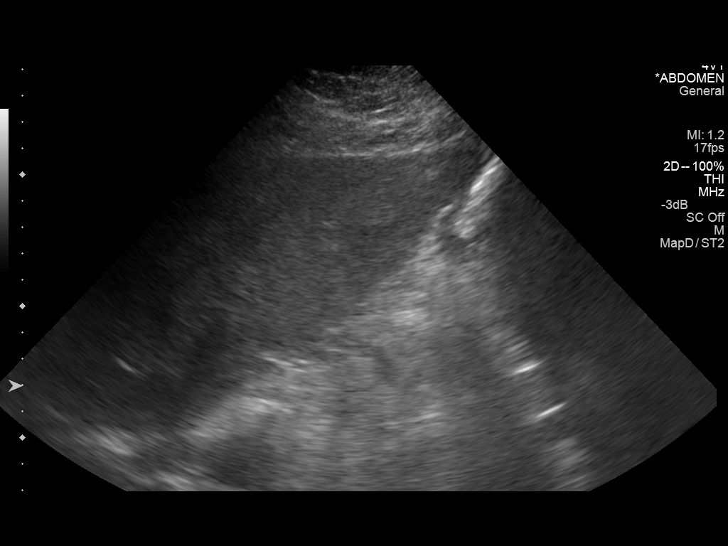
[im 39/86]
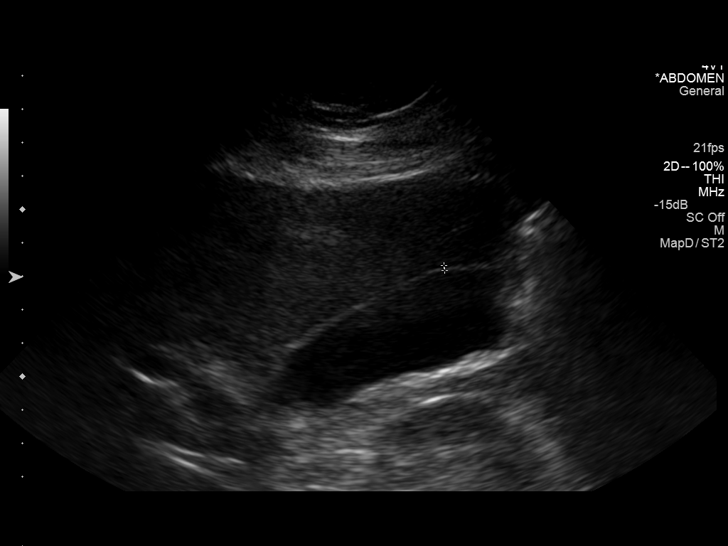
[im 47/86]
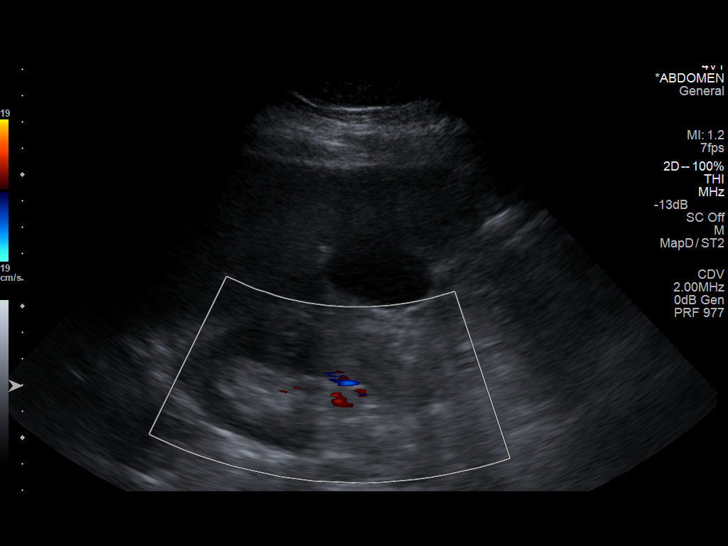
[im 54/86]
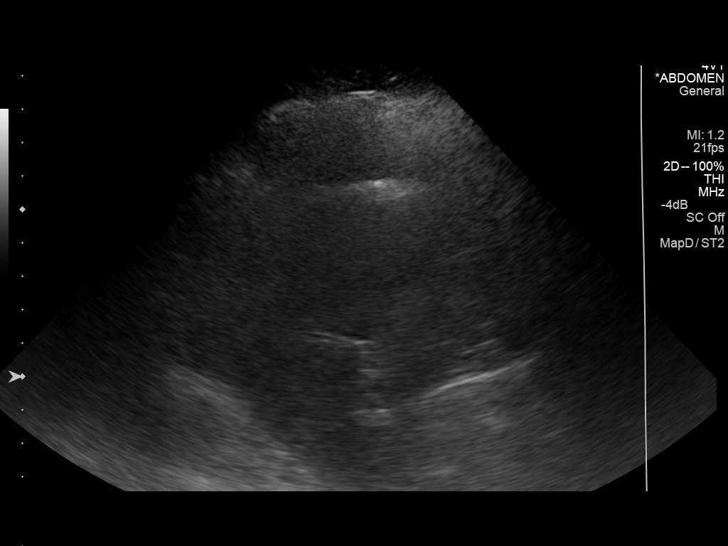
[im 57/86]
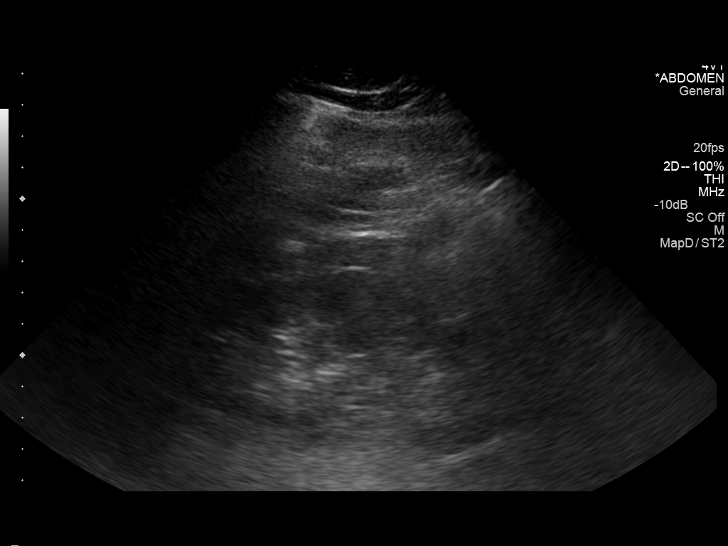
[im 64/86]
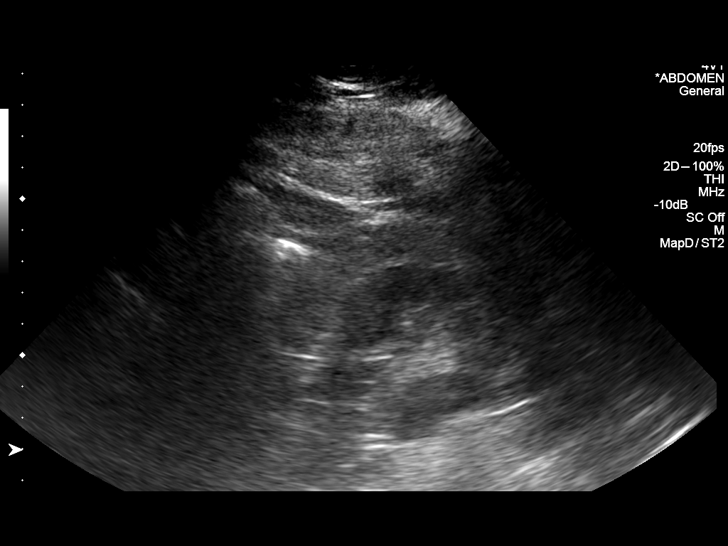
[im 71/86]
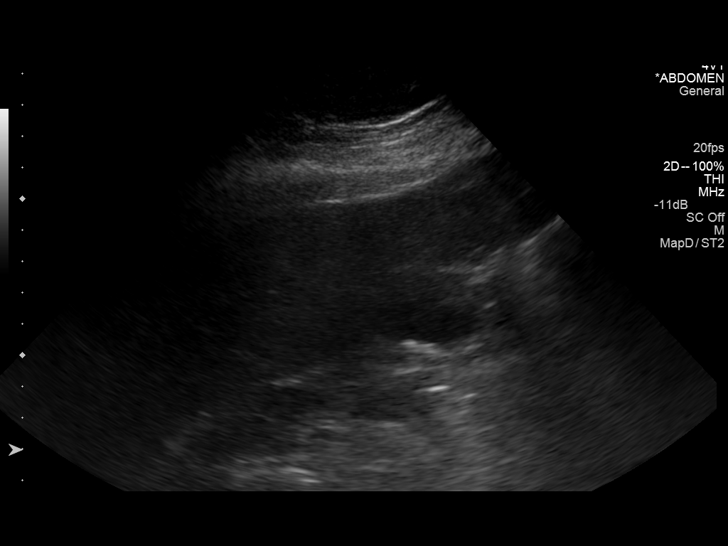
[im 78/86]
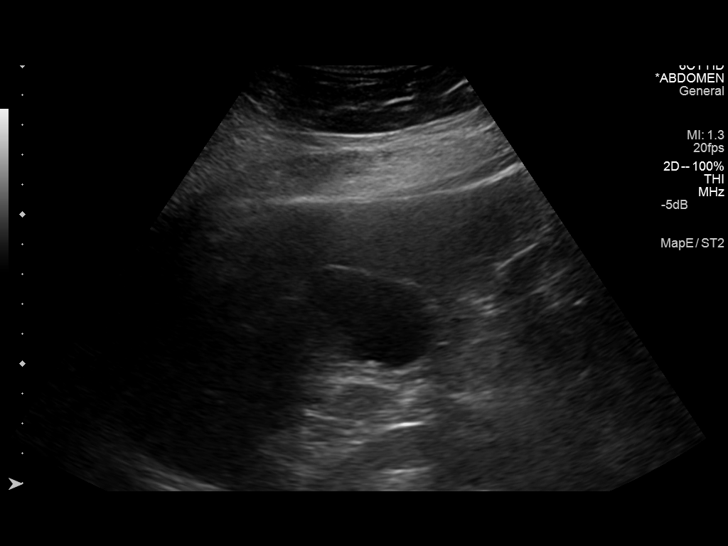
[im 86/86]
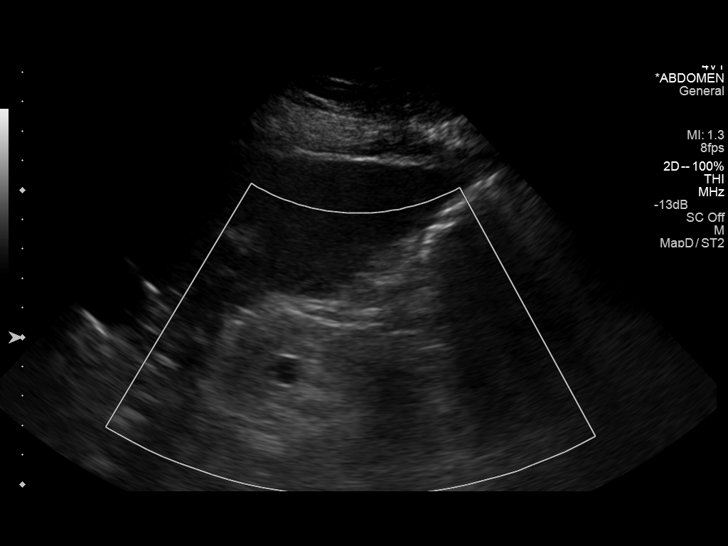

[14 of 25 positions shown; findings below may reference images not displayed]

FINDINGS: Technically difficult exam due to patient habitus and immobility.

Gallbladder: 1 or 2 tiny 3 mm nonmobile echogenic foci are seen,
suspicious for tiny polyps. No definite gallstones identified. No
evidence of gallbladder wall thickening. No sonographic Murphy sign
noted by sonographer.

Common bile duct: Diameter: 4 mm, which is within normal limits

Liver: No focal lesion identified. Within normal limits in
parenchymal echogenicity.

IVC: No abnormality visualized.

Pancreas: Visualized portion unremarkable.

Spleen: Size and appearance within normal limits.

Right Kidney: Length: 10.5 cm. Echogenicity within normal limits. No
mass or hydronephrosis visualized.

Left Kidney: Length: 10.7 cm. Echogenicity within normal limits. No
mass or hydronephrosis visualized.

Abdominal aorta: No aneurysm visualized.

Other findings: None.
IMPRESSION: Technically difficult exam. No definite gallstones, biliary
dilatation, or other significant abnormality identified.
